# Patient Record
Sex: Female | Born: 1995 | Race: White | Hispanic: No | Smoking: Never smoker
Health system: Southern US, Community
[De-identification: ages and names within clinical notes are randomized; demographics above are authoritative.]

---
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
## 2017-02-27 IMAGING — US US PELVIS COMPLETE
1 series · 15 of 25 positions shown · non-contrast
Comparison: None.

CLINICAL DATA: Cramping and pelvic/vaginal pain. LMP 10/05/2016.
White count 14.8. Urinalysis is positive for white cells, red cells,
bacteria, leukocytes, SE.

EXAM:
TRANSABDOMINAL ULTRASOUND OF PELVIS
TECHNIQUE: Transabdominal ultrasound examination of the pelvis was performed
including evaluation of the uterus, ovaries, adnexal regions, and
pelvic cul-de-sac.

[Series 1: us pelvis complete · 15 of 41 slices shown]
[im 1/41]
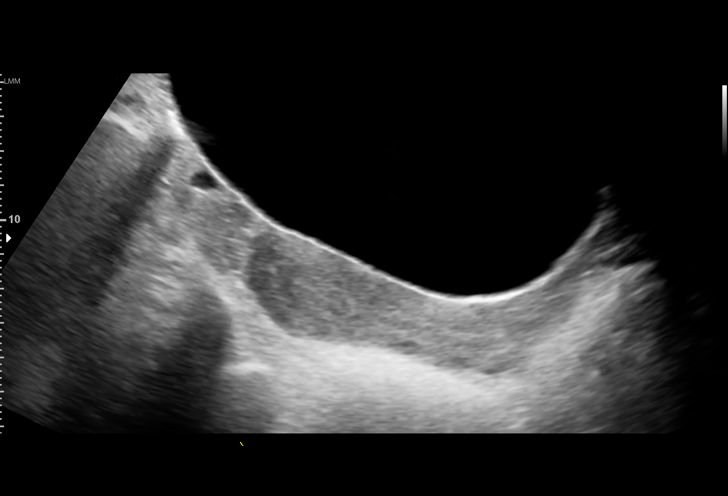
[im 4/41]
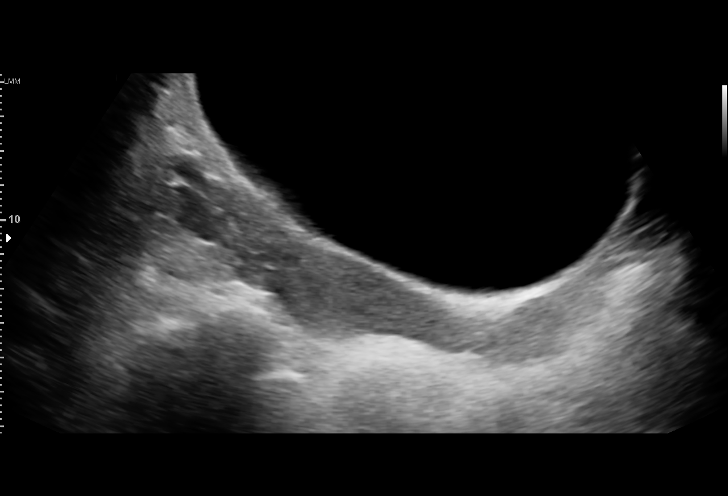
[im 7/41]
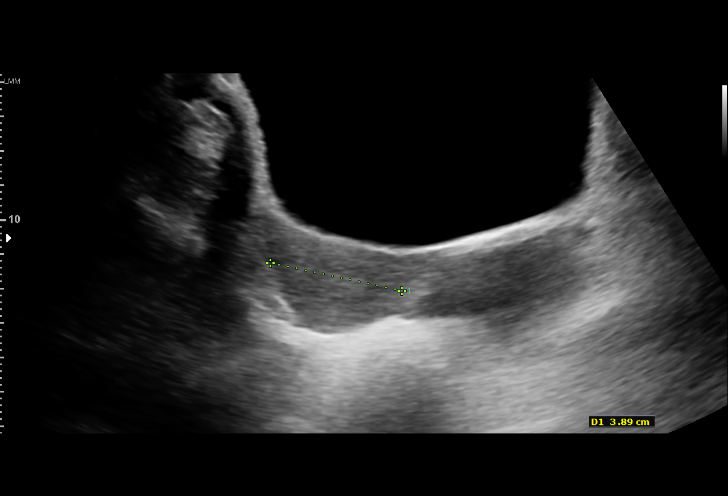
[im 9/41]
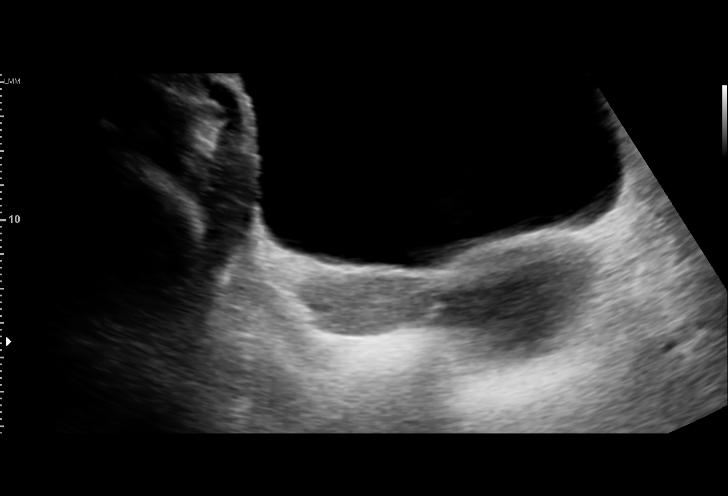
[im 12/41]
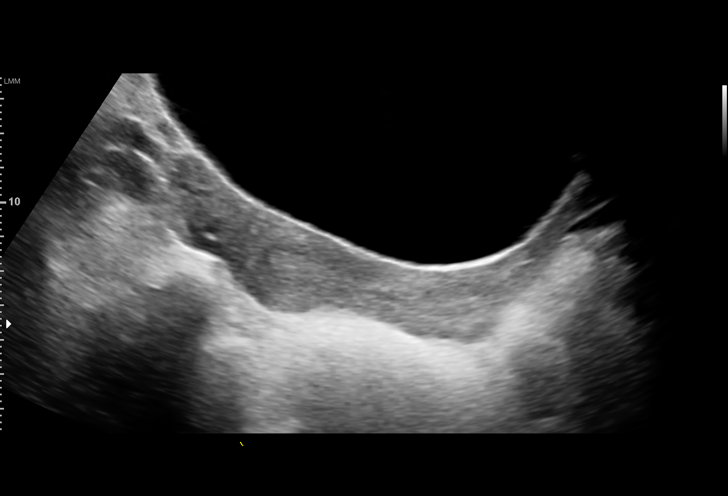
[im 16/41]
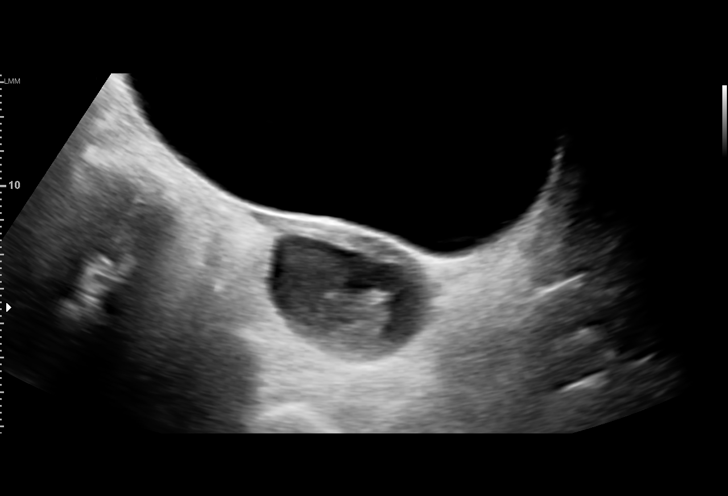
[im 17/41]
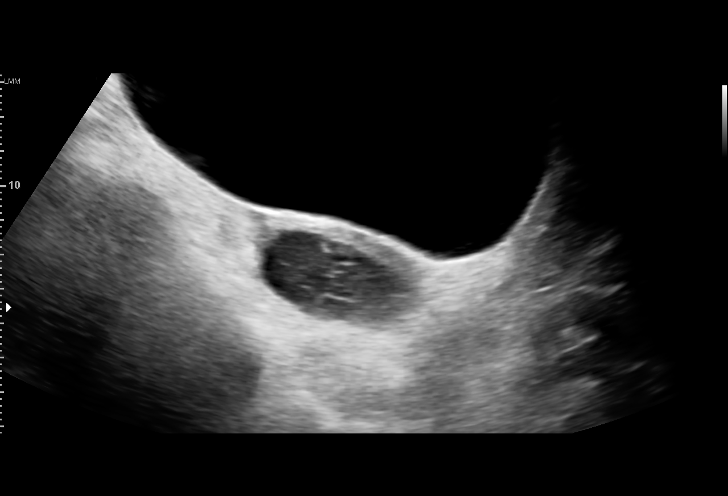
[im 21/41]
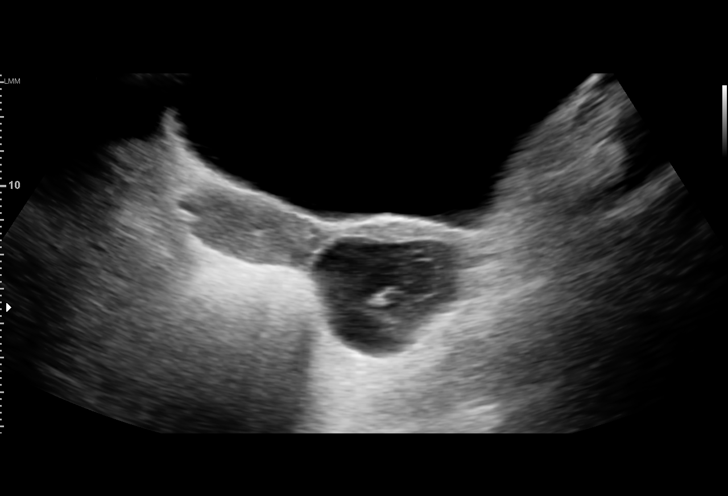
[im 24/41]
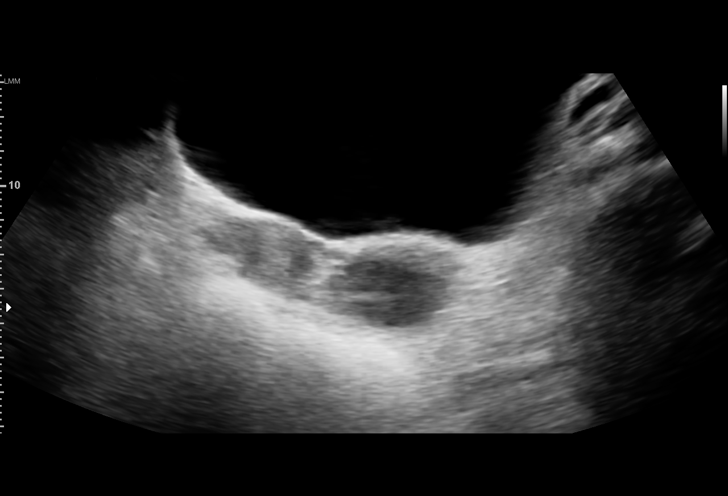
[im 26/41]
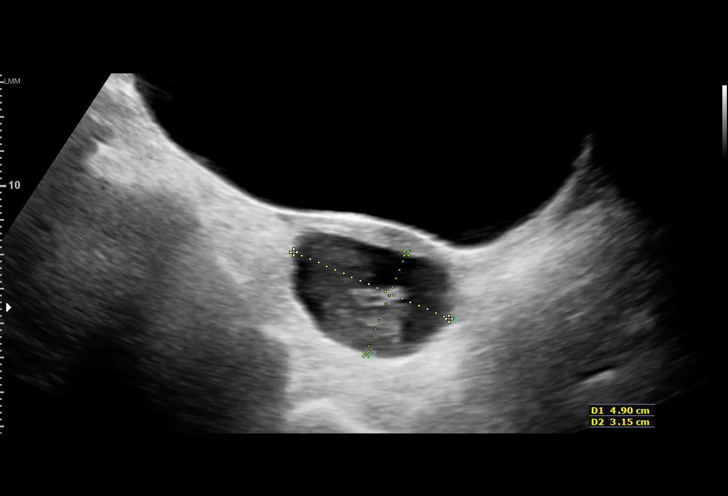
[im 29/41]
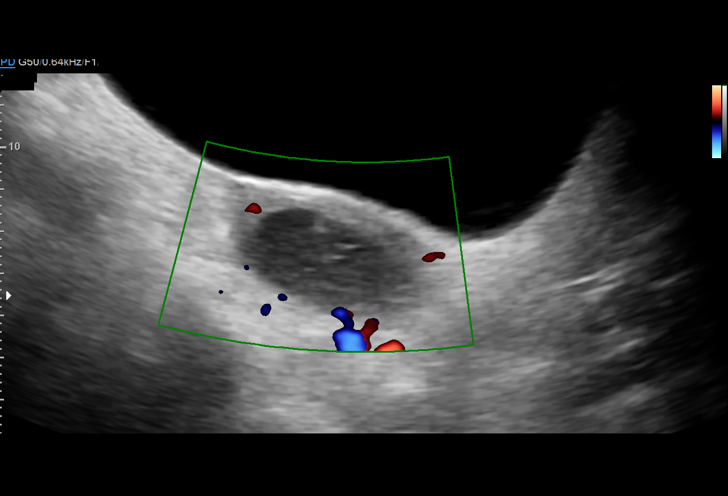
[im 32/41]
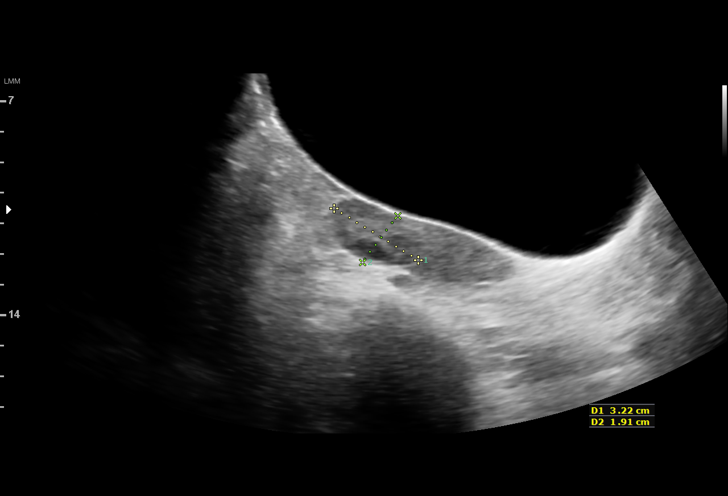
[im 34/41]
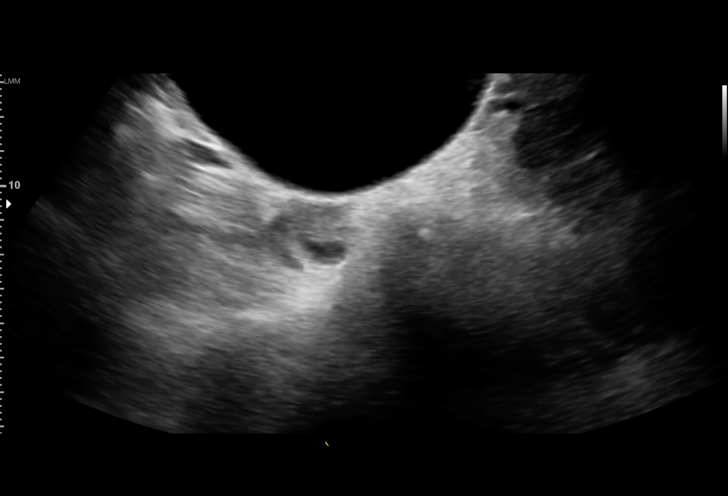
[im 37/41]
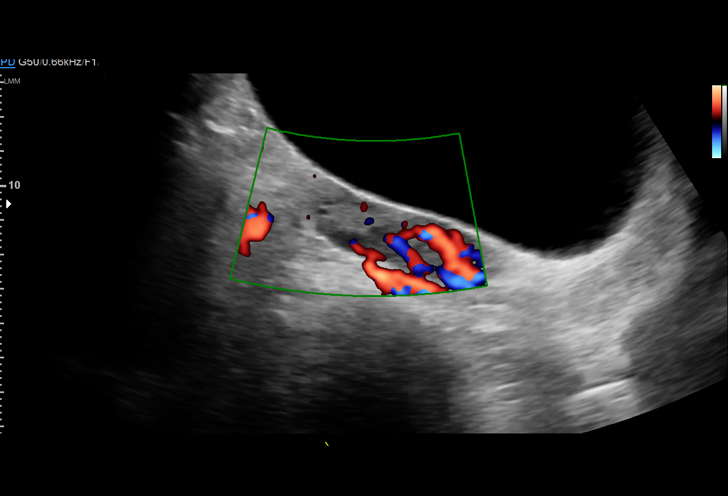
[im 41/41]
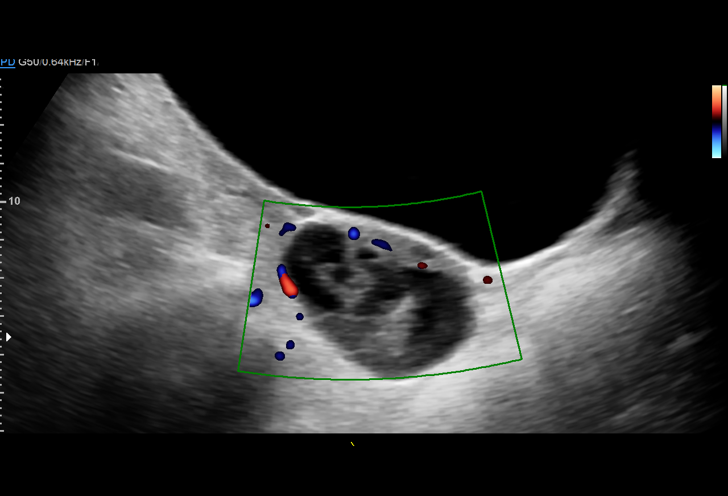

[15 of 25 positions shown; findings below may reference images not displayed]

FINDINGS: Uterus

Measurements: 8.5 x 2.7 x 3.9 cm. No fibroids or other mass
visualized.

Endometrium

Thickness: 7.5 mm.  No focal mass identified.

Right ovary

Measurements: 3.2 x 1.9 x 1.6 cm. Normal appearance/no adnexal mass.

Left ovary

Measurements: 5.3 x 4.1 x 4.9 cm. Within the left ovary there is a
multiloculated cystic mass measuring 4.9 x 3.2 x 4.3 cm. Multiple
internal septations are identified. No suspicious solid components
are identified. Flow Doppler evaluation demonstrates flow in the
periphery of the lesion in its within the ovary.

Other findings:  No abnormal free fluid.
IMPRESSION: 1. Normal appearance of the uterus and right ovary.
2. Septated cystic mass within the left ovary is favored to be
physiologic.
3. Less likely left ovarian mass could be related to tubo-ovarian
abscess, or benign or malignant ovarian neoplasm.
4. Follow-up pelvic ultrasound is recommended in 8-12 weeks to
assess for resolution.

## 2017-07-17 ENCOUNTER — Other Ambulatory Visit: Payer: Self-pay | Admitting: Certified Nurse Midwife

## 2017-08-30 ENCOUNTER — Inpatient Hospital Stay (HOSPITAL_COMMUNITY)
Admission: AD | Admit: 2017-08-30 | Discharge: 2017-08-30 | Disposition: A | Discharge status: Home / Self Care | Payer: Self-pay | Source: Ambulatory Visit | Attending: Obstetrics and Gynecology | Admitting: Obstetrics and Gynecology

## 2017-08-30 ENCOUNTER — Encounter (HOSPITAL_COMMUNITY): Payer: Self-pay | Admitting: Student

## 2017-08-30 DIAGNOSIS — N946 Dysmenorrhea, unspecified: Secondary | ICD-10-CM | POA: Insufficient documentation

## 2017-08-30 DIAGNOSIS — Z3202 Encounter for pregnancy test, result negative: Secondary | ICD-10-CM | POA: Insufficient documentation

## 2017-08-30 LAB — CBC WITH DIFFERENTIAL/PLATELET
BASOS PCT: 0 %
Basophils Absolute: 0 10*3/uL (ref 0.0–0.1)
EOS ABS: 0.1 10*3/uL (ref 0.0–0.7)
EOS PCT: 1 %
HEMATOCRIT: 39.3 % (ref 36.0–46.0)
Hemoglobin: 13.1 g/dL (ref 12.0–15.0)
Lymphocytes Relative: 17 %
Lymphs Abs: 1.7 10*3/uL (ref 0.7–4.0)
MCH: 29.8 pg (ref 26.0–34.0)
MCHC: 33.3 g/dL (ref 30.0–36.0)
MCV: 89.3 fL (ref 78.0–100.0)
MONO ABS: 0.4 10*3/uL (ref 0.1–1.0)
MONOS PCT: 4 %
NEUTROS ABS: 7.9 10*3/uL — AB (ref 1.7–7.7)
Neutrophils Relative %: 78 %
PLATELETS: 268 10*3/uL (ref 150–400)
RBC: 4.4 MIL/uL (ref 3.87–5.11)
RDW: 13.5 % (ref 11.5–15.5)
WBC: 10.1 10*3/uL (ref 4.0–10.5)

## 2017-08-30 LAB — URINALYSIS, ROUTINE W REFLEX MICROSCOPIC
BILIRUBIN URINE: NEGATIVE
GLUCOSE, UA: NEGATIVE mg/dL
KETONES UR: NEGATIVE mg/dL
NITRITE: NEGATIVE
PROTEIN: 100 mg/dL — AB
Specific Gravity, Urine: 1.025 (ref 1.005–1.030)
pH: 5 (ref 5.0–8.0)

## 2017-08-30 LAB — WET PREP, GENITAL
Clue Cells Wet Prep HPF POC: NONE SEEN
Sperm: NONE SEEN
TRICH WET PREP: NONE SEEN
Yeast Wet Prep HPF POC: NONE SEEN

## 2017-08-30 LAB — POCT PREGNANCY, URINE: Preg Test, Ur: NEGATIVE

## 2017-08-30 MED ORDER — IBUPROFEN 600 MG PO TABS: 600.0000 mg | ORAL_TABLET | Freq: Four times a day (QID) | ORAL | 0 refills | Status: DC | PRN

## 2017-08-30 MED ORDER — IBUPROFEN 600 MG PO TABS
600.0000 mg | ORAL_TABLET | Freq: Four times a day (QID) | ORAL | Status: DC | PRN
  Administered 2017-08-30: 600 mg via ORAL
  Filled 2017-08-30: qty 1

## 2017-08-30 NOTE — MAU Note (Signed)
Pt C/o Abdominal pain from the hips down more on the left side.  Pt. Says she had a cyst on her left ovary last time she was here.

## 2017-08-30 NOTE — MAU Provider Note (Signed)
History     CSN: [id number]  Arrival date and time: 08/30/17 4098  First Provider Initiated Contact with Patient 08/30/17 0815     Chief Complaint  Patient presents with  . Abdominal Pain   21 y.o. non-pregnant female here with episode of LAP and near syncope this am. She reports feeling uterine cramping that radiates into vaginal, thighs, and low back. She did not take anything for the pain. She also felt faint, cold, and clammy this am. She started her menses yesterday and had similar sx about 10 mos ago associated with menses. She is not sexually active. Denies vaginal discharge prior to menses. No urinary sx. No fevers. She was seen at [facility] last year for dysmenorrhea and prescribed monthly Progesterone but ran of refills during the summer.    No past medical history on file.  No past surgical history on file.  No family history on file.  Social History  Substance Use Topics  . Smoking status: Never Smoker  . Smokeless tobacco: Never Used  . Alcohol use Not on file    Allergies:  Allergies  Allergen Reactions  . Benzoyl Peroxide Swelling and Other (See Comments)    Reaction:  Facial swelling    Prescriptions Prior to Admission  Medication Sig Dispense Refill Last Dose  . ibuprofen (ADVIL,MOTRIN) 800 MG tablet Take 1 tablet (800 mg total) by mouth every 8 (eight) hours as needed. 30 tablet 0     Review of Systems  Constitutional: Negative for chills and fever.  Gastrointestinal: Positive for abdominal pain.  Genitourinary: Positive for vaginal bleeding and vaginal pain. Negative for dysuria.  Musculoskeletal: Positive for back pain.   Physical Exam   Blood pressure 111/64, pulse 71, temperature 97.8 F (36.6 C), temperature source Oral, resp. rate 16, height 5\' 4"  (1.626 m), weight 144 lb (65.3 kg).  Physical Exam  Nursing note and vitals reviewed. Constitutional: She is oriented to person, place, and time. She appears well-developed and well-nourished.  No distress.  HENT:  Head: Normocephalic and atraumatic.  Neck: Normal range of motion.  Cardiovascular: Normal rate.   Respiratory: No respiratory distress.  GI: Soft. She exhibits no distension and no mass. There is tenderness (mild) in the suprapubic area. There is no rebound and no guarding.  Genitourinary:  Genitourinary Comments: Declined   Musculoskeletal: Normal range of motion.  Neurological: She is alert and oriented to person, place, and time.  Skin: Skin is warm and dry.  Psychiatric: She has a normal mood and affect.   Results for orders placed or performed during the hospital encounter of 08/30/17 (from the past 24 hour(s))  Urinalysis, Routine w reflex microscopic     Status: Abnormal   Collection Time: 08/30/17  6:56 AM  Result Value Ref Range   Color, Urine YELLOW YELLOW   APPearance HAZY (A) CLEAR   Specific Gravity, Urine 1.025 1.005 - 1.030   pH 5.0 5.0 - 8.0   Glucose, UA NEGATIVE NEGATIVE mg/dL   Hgb urine dipstick LARGE (A) NEGATIVE   Bilirubin Urine NEGATIVE NEGATIVE   Ketones, ur NEGATIVE NEGATIVE mg/dL   Protein, ur 119 (A) NEGATIVE mg/dL   Nitrite NEGATIVE NEGATIVE   Leukocytes, UA TRACE (A) NEGATIVE   RBC / HPF TOO NUMEROUS TO COUNT 0 - 5 RBC/hpf   WBC, UA 6-30 0 - 5 WBC/hpf   Bacteria, UA RARE (A) NONE SEEN   Squamous Epithelial / LPF 0-5 (A) NONE SEEN   Mucus PRESENT   Pregnancy, urine  POC     Status: None   Collection Time: 08/30/17  7:14 AM  Result Value Ref Range   Preg Test, Ur NEGATIVE NEGATIVE  Wet prep, genital     Status: Abnormal   Collection Time: 08/30/17  8:36 AM  Result Value Ref Range   Yeast Wet Prep HPF POC NONE SEEN NONE SEEN   Trich, Wet Prep NONE SEEN NONE SEEN   Clue Cells Wet Prep HPF POC NONE SEEN NONE SEEN   WBC, Wet Prep HPF POC FEW (A) NONE SEEN   Sperm NONE SEEN   CBC with Differential/Platelet     Status: Abnormal   Collection Time: 08/30/17  8:45 AM  Result Value Ref Range   WBC 10.1 4.0 - 10.5 K/uL   RBC  4.40 3.87 - 5.11 MIL/uL   Hemoglobin 13.1 12.0 - 15.0 g/dL   HCT 29.539.3 62.136.0 - 30.846.0 %   MCV 89.3 78.0 - 100.0 fL   MCH 29.8 26.0 - 34.0 pg   MCHC 33.3 30.0 - 36.0 g/dL   RDW 65.713.5 84.611.5 - 96.215.5 %   Platelets 268 150 - 400 K/uL   Neutrophils Relative % 78 %   Neutro Abs 7.9 (H) 1.7 - 7.7 K/uL   Lymphocytes Relative 17 %   Lymphs Abs 1.7 0.7 - 4.0 K/uL   Monocytes Relative 4 %   Monocytes Absolute 0.4 0.1 - 1.0 K/uL   Eosinophils Relative 1 %   Eosinophils Absolute 0.1 0.0 - 0.7 K/uL   Basophils Relative 0 %   Basophils Absolute 0.0 0.0 - 0.1 K/uL   MAU Course  Procedures Ibuprofen  MDM Labs ordered and reviewed. No evidence of acute abdominal or pelvic process. Recommend start Ibuprofen the day before anticiated menses and continue for the first several days. Recommend f/u with primary GYN for other dysmenorrhea treatments. Stable for discharge home.  Assessment and Plan   1. Dysmenorrhea    Discharge home Follow up at [facility] in 1-2 weeks Rx Ibuprofen  Allergies as of 08/30/2017      Reactions   Benzoyl Peroxide Swelling, Other (See Comments)   Reaction:  Facial swelling      Medication List    TAKE these medications   ibuprofen 600 MG tablet Commonly known as:  ADVIL,MOTRIN Take 1 tablet (600 mg total) by mouth every 6 (six) hours as needed for cramping. What changed:  medication strength  how much to take  reasons to take this      Donette LarryMelanie Jacquelina Hewins, CNM 08/30/2017, 8:31 AM

## 2017-08-30 NOTE — Discharge Instructions (Signed)
Dysmenorrhea Dysmenorrhea means painful cramps during your period (menstrual period). You will have pain in your lower belly (abdomen). The pain is caused by the tightening (contracting) of the muscles of the womb (uterus). The pain may be mild or very bad. With this condition, you may:  Have a headache.  Feel sick to your stomach (nauseous).  Throw up (vomit).  Have lower back pain. Follow these instructions at home: Helping pain and cramping   Put heat on your lower back or belly when you have pain or cramps. Use the heat source that your doctor tells you to use.  Place a towel between your skin and the heat.  Leave the heat on for 20-30 minutes.  Remove the heat if your skin turns bright red. This is especially important if you cannot feel pain, heat, or cold.  Do not have a heating pad on during sleep.  Do aerobic exercises. These include walking, swimming, or biking. These may help with cramps.  Massage your lower back or belly. This may help lessen pain. General instructions   Take over-the-counter and prescription medicines only as told by your doctor.  Do not drive or use heavy machinery while taking prescription pain medicine.  Avoid alcohol and caffeine during and right before your period. These can make cramps worse.  Do not use any products that have nicotine or tobacco. These include cigarettes and e-cigarettes. If you need help quitting, ask your doctor.  Keep all follow-up visits as told by your doctor. This is important. Contact a doctor if:  You have pain that gets worse.  You have pain that does not get better with medicine.  You have pain during sex.  You feel sick to your stomach or you throw up during your period, and medicine does not help. Get help right away if:  You pass out (faint). Summary  Dysmenorrhea means painful cramps during your period (menstrual period).  Put heat on your lower back or belly when you have pain or cramps.  Do  exercises like walking, swimming, or biking to help with cramps.  Contact a doctor if you have pain during sex. This information is not intended to replace advice given to you by your health care provider. Make sure you discuss any questions you have with your health care provider. Document Released: 01/26/2009 Document Revised: 11/16/2016 Document Reviewed: 11/16/2016 Elsevier Interactive Patient Education  2017 [employer]  

## 2017-08-31 LAB — GC/CHLAMYDIA PROBE AMP (~~LOC~~) NOT AT ARMC
CHLAMYDIA, DNA PROBE: NEGATIVE
NEISSERIA GONORRHEA: NEGATIVE

## 2019-06-10 ENCOUNTER — Encounter (HOSPITAL_COMMUNITY): Payer: Self-pay | Admitting: Emergency Medicine

## 2019-06-10 ENCOUNTER — Observation Stay (HOSPITAL_COMMUNITY)
Admission: EM | Admit: 2019-06-10 | Discharge: 2019-06-11 | Disposition: A | Discharge status: Home / Self Care | Payer: Medicaid Other | Attending: General Surgery | Admitting: General Surgery

## 2019-06-10 ENCOUNTER — Emergency Department (HOSPITAL_COMMUNITY): Payer: Medicaid Other

## 2019-06-10 ENCOUNTER — Other Ambulatory Visit: Payer: Self-pay

## 2019-06-10 DIAGNOSIS — Z20828 Contact with and (suspected) exposure to other viral communicable diseases: Secondary | ICD-10-CM | POA: Diagnosis not present

## 2019-06-10 DIAGNOSIS — Z9049 Acquired absence of other specified parts of digestive tract: Secondary | ICD-10-CM

## 2019-06-10 DIAGNOSIS — K3580 Unspecified acute appendicitis: Principal | ICD-10-CM | POA: Diagnosis present

## 2019-06-10 DIAGNOSIS — R1031 Right lower quadrant pain: Secondary | ICD-10-CM

## 2019-06-10 LAB — COMPREHENSIVE METABOLIC PANEL
ALT: 12 U/L (ref 0–44)
AST: 16 U/L (ref 15–41)
Albumin: 4.4 g/dL (ref 3.5–5.0)
Alkaline Phosphatase: 51 U/L (ref 38–126)
Anion gap: 11 (ref 5–15)
BUN: 9 mg/dL (ref 6–20)
CO2: 24 mmol/L (ref 22–32)
Calcium: 9.6 mg/dL (ref 8.9–10.3)
Chloride: 105 mmol/L (ref 98–111)
Creatinine, Ser: 0.83 mg/dL (ref 0.44–1.00)
GFR calc Af Amer: >60 mL/min (ref >60)
GFR calc non Af Amer: >60 mL/min (ref >60)
Glucose, Bld: 89 mg/dL (ref 70–99)
Potassium: 3.9 mmol/L (ref 3.5–5.1)
Sodium: 140 mmol/L (ref 135–145)
Total Bilirubin: 0.7 mg/dL (ref 0.3–1.2)
Total Protein: 7.7 g/dL (ref 6.5–8.1)

## 2019-06-10 LAB — URINALYSIS, ROUTINE W REFLEX MICROSCOPIC
Bilirubin Urine: NEGATIVE
Glucose, UA: NEGATIVE mg/dL
Hgb urine dipstick: NEGATIVE
Ketones, ur: 5 mg/dL — AB
Leukocytes,Ua: NEGATIVE
Nitrite: NEGATIVE
Protein, ur: NEGATIVE mg/dL
Specific Gravity, Urine: 1.03 (ref 1.005–1.030)
pH: 5 (ref 5.0–8.0)

## 2019-06-10 LAB — CBC
HCT: 42.8 % (ref 36.0–46.0)
Hemoglobin: 13.6 g/dL (ref 12.0–15.0)
MCH: 29.7 pg (ref 26.0–34.0)
MCHC: 31.8 g/dL (ref 30.0–36.0)
MCV: 93.4 fL (ref 80.0–100.0)
Platelets: 311 10*3/uL (ref 150–400)
RBC: 4.58 MIL/uL (ref 3.87–5.11)
RDW: 13 % (ref 11.5–15.5)
WBC: 9.5 10*3/uL (ref 4.0–10.5)
nRBC: 0 % (ref 0.0–0.2)

## 2019-06-10 LAB — SARS CORONAVIRUS 2 BY RT PCR (HOSPITAL ORDER, PERFORMED IN ~~LOC~~ HOSPITAL LAB): SARS Coronavirus 2: NEGATIVE

## 2019-06-10 LAB — I-STAT BETA HCG BLOOD, ED (MC, WL, AP ONLY): I-stat hCG, quantitative: <5 m[IU]/mL (ref <5)

## 2019-06-10 LAB — LIPASE, BLOOD: Lipase: 22 U/L (ref 11–51)

## 2019-06-10 MED ORDER — METRONIDAZOLE IN NACL 5-0.79 MG/ML-% IV SOLN
500.0000 mg | Freq: Three times a day (TID) | INTRAVENOUS | Status: DC
  Administered 2019-06-11: 500 mg via INTRAVENOUS
  Filled 2019-06-10: qty 100

## 2019-06-10 MED ORDER — HYDROMORPHONE HCL 1 MG/ML IJ SOLN: 0.5000 mg | INTRAMUSCULAR | Status: DC | PRN

## 2019-06-10 MED ORDER — METRONIDAZOLE IN NACL 5-0.79 MG/ML-% IV SOLN
500.0000 mg | Freq: Once | INTRAVENOUS | Status: AC
  Administered 2019-06-10: 500 mg via INTRAVENOUS
  Filled 2019-06-10: qty 100

## 2019-06-10 MED ORDER — KETOROLAC TROMETHAMINE 30 MG/ML IJ SOLN: 30.0000 mg | Freq: Four times a day (QID) | INTRAMUSCULAR | Status: DC | PRN

## 2019-06-10 MED ORDER — DEXTROSE-NACL 5-0.9 % IV SOLN
INTRAVENOUS | Status: DC
  Administered 2019-06-11 (×2): via INTRAVENOUS

## 2019-06-10 MED ORDER — ONDANSETRON 4 MG PO TBDP: 4.0000 mg | ORAL_TABLET | Freq: Four times a day (QID) | ORAL | Status: DC | PRN

## 2019-06-10 MED ORDER — IOHEXOL 300 MG/ML  SOLN
100.0000 mL | Freq: Once | INTRAMUSCULAR | Status: AC | PRN
  Administered 2019-06-10: 100 mL via INTRAVENOUS

## 2019-06-10 MED ORDER — SODIUM CHLORIDE 0.9 % IV SOLN
2.0000 g | INTRAVENOUS | Status: DC
  Filled 2019-06-10: qty 20

## 2019-06-10 MED ORDER — SODIUM CHLORIDE 0.9% FLUSH: 3.0000 mL | Freq: Once | INTRAVENOUS | Status: DC

## 2019-06-10 MED ORDER — SODIUM CHLORIDE 0.9 % IV BOLUS
500.0000 mL | Freq: Once | INTRAVENOUS | Status: AC
  Administered 2019-06-10: 500 mL via INTRAVENOUS

## 2019-06-10 MED ORDER — SODIUM CHLORIDE 0.9 % IV SOLN
2.0000 g | Freq: Once | INTRAVENOUS | Status: AC
  Administered 2019-06-10: 2 g via INTRAVENOUS
  Filled 2019-06-10: qty 20

## 2019-06-10 MED ORDER — ONDANSETRON HCL 4 MG/2ML IJ SOLN: 4.0000 mg | Freq: Four times a day (QID) | INTRAMUSCULAR | Status: DC | PRN

## 2019-06-10 NOTE — Discharge Instructions (Addendum)
The ED would like you to follow-up with the women's clinic.  They have a walk-in gynecology clinic Monday-  Wednesday from 4 PM to 7:30 PM.  No appointment is necessary.  [facility] LAPAROSCOPIC SURGERY: POST OP INSTRUCTIONS Always review your discharge instruction sheet given to you by the facility where your surgery was performed. IF YOU HAVE DISABILITY OR FAMILY LEAVE FORMS, YOU MUST BRING THEM TO THE OFFICE FOR PROCESSING.   DO NOT GIVE THEM TO YOUR DOCTOR.  PAIN CONTROL  1. First take acetaminophen (Tylenol) AND/or ibuprofen (Advil) to control your pain after surgery.  Follow directions on package.  Taking acetaminophen (Tylenol) and/or ibuprofen (Advil) regularly after surgery will help to control your pain and lower the amount of prescription pain medication you may need.  You should not take more than 3,000 mg (3 grams) of acetaminophen (Tylenol) in 24 hours.  You should not take ibuprofen (Advil), aleve, motrin, naprosyn or other NSAIDS if you have a history of stomach ulcers or chronic kidney disease.  2. A prescription for pain medication may be given to you upon discharge.  Take your pain medication as prescribed, if you still have uncontrolled pain after taking acetaminophen (Tylenol) or ibuprofen (Advil). 3. Use ice packs to help control pain. 4. If you need a refill on your pain medication, please contact your pharmacy.  They will contact our office to request authorization. Prescriptions will not be filled after 5pm or on week-ends.  HOME MEDICATIONS 5. Take your usually prescribed medications unless otherwise directed.  DIET 6. You should follow a light diet the first few days after arrival home.  Be sure to include lots of fluids daily. Avoid fatty, fried foods.   CONSTIPATION 7. It is common to experience some constipation after surgery and if you are taking pain medication.  Increasing fluid intake and taking a stool softener (such as Colace) will  usually help or prevent this problem from occurring.  A mild laxative (Milk of Magnesia or Miralax) should be taken according to package instructions if there are no bowel movements after 48 hours.  WOUND/INCISION CARE 8. Most patients will experience some swelling and bruising in the area of the incisions.  Ice packs will help.  Swelling and bruising can take several days to resolve.  9. Unless discharge instructions indicate otherwise, follow guidelines below  a. STERI-STRIPS - you may remove your outer bandages 48 hours after surgery, and you may shower at that time.  You have steri-strips (small skin tapes) in place directly over the incision.  These strips should be left on the skin for 7-10 days.   b. DERMABOND/SKIN GLUE - you may shower in 24 hours.  The glue will flake off over the next 2-3 weeks. 10. Any sutures or staples will be removed at the office during your follow-up visit.  ACTIVITIES 11. You may resume regular (light) daily activities beginning the next day--such as daily self-care, walking, climbing stairs--gradually increasing activities as tolerated.  You may have sexual intercourse when it is comfortable.  Refrain from any heavy lifting or straining until approved by your doctor. a. You may drive when you are no longer taking prescription pain medication, you can comfortably wear a seatbelt, and you can safely maneuver your car and apply brakes.  FOLLOW-UP 12. You should see your doctor in the office for a follow-up appointment approximately 2-3 weeks after your surgery.  You should have been given your post-op/follow-up appointment when your surgery was scheduled.  If you did not receive a post-op/follow-up appointment, make sure that you call for this appointment within a day or two after you arrive home to insure a convenient appointment time.  OTHER INSTRUCTIONS 13.   WHEN TO CALL YOUR DOCTOR: 1. Fever over 101.0 2. Inability to urinate 3. Continued bleeding from  incision. 4. Increased pain, redness, or drainage from the incision. 5. Increasing abdominal pain  The clinic staff is available to answer your questions during regular business hours.  Please dont hesitate to call and ask to speak to one of the nurses for clinical concerns.  If you have a medical emergency, go to the nearest emergency room or call 911.  [facility] is always on call at the hospital. [address], [address], [place], [place]  [id number] ? [address] [address], [place], [place]   [id number] [phone] ? [phone] ? FAX [phone] Web site: [email or web address]     Managing Your Pain After Surgery Without Opioids    Thank you for participating in our program to help patients manage their pain after surgery without opioids. This is part of our effort to provide you with the best care possible, without exposing you or your family to the risk that opioids pose.  What pain can I expect after surgery? You can expect to have some pain after surgery. This is normal. The pain is typically worse the day after surgery, and quickly begins to get better. Many studies have found that many patients are able to manage their pain after surgery with Over-the-Counter (OTC) medications such as Tylenol and Motrin. If you have a condition that does not allow you to take Tylenol or Motrin, notify your surgical team.  How will I manage my pain? The best strategy for controlling your pain after surgery is around the clock pain control with Tylenol (acetaminophen) and Motrin (ibuprofen or Advil). Alternating these medications with each other allows you to maximize your pain control. In addition to Tylenol and Motrin, you can use heating pads or ice packs on your incisions to help reduce your pain.  How will I alternate your regular strength over-the-counter pain medication? You will take a dose of pain medication every three hours. ; Start by  taking 650 mg of Tylenol (2 pills of 325 mg) ; 3 hours later take 600 mg of Motrin (3 pills of 200 mg) ; 3 hours after taking the Motrin take 650 mg of Tylenol ; 3 hours after that take 600 mg of Motrin.   - 1 -  See example - if your first dose of Tylenol is at 12:00 PM   12:00 PM Tylenol 650 mg (2 pills of 325 mg)  3:00 PM Motrin 600 mg (3 pills of 200 mg)  6:00 PM Tylenol 650 mg (2 pills of 325 mg)  9:00 PM Motrin 600 mg (3 pills of 200 mg)  Continue alternating every 3 hours   We recommend that you follow this schedule around-the-clock for at least 3 days after surgery, or until you feel that it is no longer needed. Use the table on the last page of this handout to keep track of the medications you are taking. Important: Do not take more than 3000mg  of Tylenol or 2300mg  of Motrin in a 24-hour period. Do not take ibuprofen/Motrin if you have a history of bleeding stomach ulcers, severe kidney disease, &/or actively taking a blood thinner  What if I still have pain? If you have pain that  is not controlled with the over-the-counter pain medications (Tylenol and Motrin or Advil) you might have what we call breakthrough pain. You will receive a prescription for a small amount of an opioid pain medication such as Oxycodone, Tramadol, or Tylenol with Codeine. Use these opioid pills in the first 24 hours after surgery if you have breakthrough pain. Do not take more than 1 pill every 4-6 hours.  If you still have uncontrolled pain after using all opioid pills, don't hesitate to call our staff using the number provided. We will help make sure you are managing your pain in the best way possible, and if necessary, we can provide a prescription for additional pain medication.   Day 1    Time  Name of Medication Number of pills taken  Amount of Acetaminophen  Pain Level   Comments  AM PM       AM PM       AM PM       AM PM       AM PM       AM PM       AM PM       AM PM        Total Daily amount of Acetaminophen Do not take more than  3,000 mg per day      Day 2    Time  Name of Medication Number of pills taken  Amount of Acetaminophen  Pain Level   Comments  AM PM       AM PM       AM PM       AM PM       AM PM       AM PM       AM PM       AM PM       Total Daily amount of Acetaminophen Do not take more than  3,000 mg per day      Day 3    Time  Name of Medication Number of pills taken  Amount of Acetaminophen  Pain Level   Comments  AM PM       AM PM       AM PM       AM PM          AM PM       AM PM       AM PM       AM PM       Total Daily amount of Acetaminophen Do not take more than  3,000 mg per day      Day 4    Time  Name of Medication Number of pills taken  Amount of Acetaminophen  Pain Level   Comments  AM PM       AM PM       AM PM       AM PM       AM PM       AM PM       AM PM       AM PM       Total Daily amount of Acetaminophen Do not take more than  3,000 mg per day      Day 5    Time  Name of Medication Number of pills taken  Amount of Acetaminophen  Pain Level   Comments  AM PM       AM PM  AM PM       AM PM       AM PM       AM PM       AM PM       AM PM       Total Daily amount of Acetaminophen Do not take more than  3,000 mg per day       Day 6    Time  Name of Medication Number of pills taken  Amount of Acetaminophen  Pain Level  Comments  AM PM       AM PM       AM PM       AM PM       AM PM       AM PM       AM PM       AM PM       Total Daily amount of Acetaminophen Do not take more than  3,000 mg per day      Day 7    Time  Name of Medication Number of pills taken  Amount of Acetaminophen  Pain Level   Comments  AM PM       AM PM       AM PM       AM PM       AM PM       AM PM       AM PM       AM PM       Total Daily amount of Acetaminophen Do not take more than  3,000 mg per day        For additional information about how  and where to safely dispose of unused opioid medications - [employer]https://www.[employer].org  Disclaimer: This document contains information and/or instructional materials adapted from [facility] for the typical patient with your condition. It does not replace medical advice from your health care provider because your experience may differ from that of the typical patient. Talk to your health care provider if you have any questions about this document, your condition or your treatment plan. Adapted from [facility]

## 2019-06-10 NOTE — ED Triage Notes (Signed)
TC to [place] to report Pt drank a pitcher of water and now feels full.

## 2019-06-10 NOTE — ED Notes (Signed)
Patient transported to Ultrasound 

## 2019-06-10 NOTE — ED Provider Notes (Signed)
I assumed care of patient from previous team, please see their note for full H&P.  Briefly patient is here for evaluation of pelvic pain.  It initially started around her umbilicus and is worse in the right lower quadrant.   Physical Exam  BP 108/76   Pulse 74   Temp 98.4 F (36.9 C) (Oral)   Resp 16   Ht 5\' 5"  (1.651 m)   Wt 64 kg   LMP 05/29/2019   SpO2 100%   BMI 23.48 kg/m   Physical Exam Vitals signs and nursing note reviewed.  Constitutional:      General: She is not in acute distress.    Appearance: She is well-developed. She is not diaphoretic.  HENT:     Head: Normocephalic and atraumatic.  Eyes:     General: No scleral icterus.       Right eye: No discharge.        Left eye: No discharge.     Conjunctiva/sclera: Conjunctivae normal.  Neck:     Musculoskeletal: Normal range of motion.  Cardiovascular:     Rate and Rhythm: Normal rate and regular rhythm.  Pulmonary:     Effort: Pulmonary effort is normal. No respiratory distress.     Breath sounds: No stridor.  Abdominal:     General: There is no distension.     Tenderness: There is abdominal tenderness (Worse in RLQ) in the right lower quadrant, periumbilical area and suprapubic area. Positive signs include Murphy's sign.  Musculoskeletal:        General: No deformity.  Skin:    General: Skin is warm and dry.  Neurological:     Mental Status: She is alert.     Motor: No abnormal muscle tone.  Psychiatric:        Behavior: Behavior normal.     ED Course/Procedures   Clinical Course as of Jun 09 2320  Tue Jun 10, 2019  2040 Appendix is 44mm, wall enhancement, no stranding Spoke with radiologist who reports that CT imaging is equivocal, recommend surgical consult.   [EH]  2053 Spoke with Dr. Rosendo Gros from general surgery who will come see patient for admission.   [EH]    Clinical Course User Index [EH] Lorin Glass, PA-C    Procedures   Medications  sodium chloride 0.9 % bolus 500 mL (0  mLs Intravenous Stopped 06/10/19 2144)  iohexol (OMNIPAQUE) 300 MG/ML solution 100 mL (100 mLs Intravenous Contrast Given 06/10/19 2015)  cefTRIAXone (ROCEPHIN) 2 g in sodium chloride 0.9 % 100 mL IVPB (2 g Intravenous New Bag/Given 06/10/19 2124)    And  metroNIDAZOLE (FLAGYL) IVPB 500 mg (500 mg Intravenous New Bag/Given 06/10/19 2123)    US Pelvis (transabdominal Only)  Result Date: 06/10/2019 CLINICAL DATA:  Right lower quadrant abdomen pain for 2 days EXAM: TRANSABDOMINAL ULTRASOUND OF PELVIS TECHNIQUE: Transabdominal ultrasound examination of the pelvis was performed including evaluation of the uterus, ovaries, adnexal regions, and pelvic cul-de-sac. COMPARISON:  None. FINDINGS: Uterus Measurements: 7.8 x 2.8 x 4.1 cm = volume: 47.3 mL. No fibroids or other mass visualized. Endometrium Thickness: 4.1.  No focal abnormality visualized. Right ovary Measurements: 5 x 2.4 x 2 cm = volume: 12.66 mL. Normal appearance/no adnexal mass. Left ovary Measurements: 2.6 x 1.6 x 2.6 cm = volume: 5.4 mL. Normal appearance/no adnexal mass. Other findings:  No abnormal free fluid. IMPRESSION: Normal pelvic ultrasound. Electronically Signed   By: Abelardo Diesel M.D.   On: 06/10/2019 18:12   Ct  Abdomen Pelvis W Contrast  Result Date: 06/10/2019 CLINICAL DATA:  Abdominal pain and vaginal discharge EXAM: CT ABDOMEN AND PELVIS WITH CONTRAST TECHNIQUE: Multidetector CT imaging of the abdomen and pelvis was performed using the standard protocol following bolus administration of intravenous contrast. CONTRAST:  100mL OMNIPAQUE IOHEXOL 300 MG/ML  SOLN COMPARISON:  None. FINDINGS: Lower chest: There is slight atelectatic change in the left base. Lung bases otherwise are clear. Hepatobiliary: No focal liver lesions are evident. The gallbladder wall is not appreciably thickened. There is no biliary duct dilatation. Pancreas: There is no pancreatic mass or inflammatory focus. Spleen: No splenic lesions are evident. Adrenals/Urinary  Tract: Adrenals bilaterally appear unremarkable. Kidneys bilaterally show no evident mass or hydronephrosis on either side. There is no appreciable renal or ureteral calculus on either side. Urinary bladder is midline with wall thickness within normal limits. Stomach/Bowel: Rectum is mildly distended with stool and air. There is no appreciable bowel wall or mesenteric thickening. There is no evident bowel obstruction. The terminal ileum appears unremarkable. There is no evident free air or portal venous air. Vascular/Lymphatic: There is no abdominal aortic aneurysm. No vascular lesions are evident. No adenopathy evident in the abdomen or pelvis. Reproductive: Uterus is anteverted.  No pelvic mass evident. Other: The appendix is prominent, measuring up to 9 mm in diameter. There is subtle enhancement of the appendiceal wall. There is no appendicolith. There is no appreciable soft tissue stranding. No fluid or abscess in this area. No evident perforation. No abscess or ascites evident in the abdomen or pelvis. Musculoskeletal: No blastic or lytic bone lesions. No intramuscular or abdominal wall lesions. IMPRESSION: 1. Appendix is mildly distended to 9 mm with mild enhancement of the appendiceal wall. These are findings which are concerning for early changes of appendicitis. There is no soft tissue stranding or fluid in the appendiceal region. No appendicoliths. These findings must be viewed as somewhat equivocal but do warrant close clinical and laboratory surveillance. If clinical symptoms so warrant, surgical consultation may be advised given these findings. Note that the appendix arises inferiorly and medially from the cecum, located fairly anterior in the upper to mid pelvis. 2. No bowel obstruction. No abscess in the abdomen or pelvis. Rectum mildly distended with stool and air. 3. No evident renal or ureteral calculus. No hydronephrosis. Urinary bladder midline with wall thickness within normal limits. Critical  Value/emergent results were called by telephone at the time of interpretation on 06/10/2019 at 8:39 pm to [facility]ale Schlack, PA , who verbally acknowledged these results. Electronically Signed   By: Bretta BangWilliam  Woodruff III M.D.   On: 06/10/2019 20:41     MDM  Plan is to follow-up on pelvic ultrasound, if normal anticipate discharge home.  On my exam/repeat abdominal exam patient has pain in the right lower quadrant primarily.  On review of labs she also has a slight leukocytosis.  While she is having spotting and bleeding, her pelvic ultrasound was normal.  I had discussion with the patient and her mother about need to evaluate for appendicitis giving that her pain is in the periumbilical region settled into the right lower quadrant with mild leukocytosis and a normal pelvic ultrasound.  CT scan ordered.  CT scan shows mild distention of the appendix to 9 mm with mild enhancement of the wall concerning for the early changes of appendicitis.  Given her clinical picture including periumbilical pain into the right lower quadrant general surgery was consulted.  I spoke with Dr. Derrell Lollingamirez who will see the  patient for admission.  Orders placed for Flagyl and Rocephin.  She was given saline while in the department.  She was offered pain and nausea medicine which she refused.     Norman ClayHammond, Jaquay Morneault W, PA-C 06/10/19 Janetta [id number]    Benjiman CorePickering, Nathan, MD 06/11/19 [phone]

## 2019-06-10 NOTE — ED Triage Notes (Addendum)
Pt provided with a pitcure of water and instructed to call staff when she feels her bladder is full. Pt instructed not void because  [place] needs Pt to have full bladder for scan.

## 2019-06-10 NOTE — H&P (Signed)
Heidi Benjamin is an 23 y.o. female.   Chief Complaint: Abdominal pain   HPI: Patient is a 23 year old female who comes in today secondary to 2 days of abdominal pain.  Patient states that pain began periumbilically and then slowly moved to the pelvic region.  She states that this eventually migrated to the right lower quadrant area.  Patient states that she had no nausea or vomiting.  She denies any diarrhea.  Patient states that secondary to abdominal pain she had minimal energy or movement today.  Secondary to pain she presented to the ER for further evaluation  Upon evaluation the ER she underwent CT scan and laboratory studies.  Laboratory studies revealed normal white count.  CT scan revealed a thickened appendiceal wall, no fat stranding.  I did review these personally.  General surgery was consulted for further evaluation and management.  History reviewed. No pertinent past medical history.  History reviewed. No pertinent surgical history.  No family history on file. Social History:  reports that she has never smoked. She has never used smokeless tobacco. She reports that she does not drink alcohol or use drugs.  Allergies:  Allergies  Allergen Reactions  . Benzoyl Peroxide Swelling and Other (See Comments)    Reaction:  Facial swelling  . Tomato Nausea And Vomiting    (Not in a hospital admission)   Results for orders placed or performed during the hospital encounter of 06/10/19 (from the past 48 hour(s))  Lipase, blood     Status: None   Collection Time: 06/10/19 12:00 PM  Result Value Ref Range   Lipase 22 11 - 51 U/L    Comment: Performed at [facility], [address] [place], [place] [address]  Comprehensive metabolic panel     Status: None   Collection Time: 06/10/19 12:00 PM  Result Value Ref Range   Sodium 140 135 - 145 mmol/L   Potassium 3.9 3.5 - 5.1 mmol/L   Chloride 105 98 - 111 mmol/L   CO2 24 22 - 32 mmol/L   Glucose, Bld 89 70 - 99 mg/dL   BUN 9 6  - 20 mg/dL   Creatinine, Ser 7.820.83 0.44 - 1.00 mg/dL   Calcium 9.6 8.9 - 95.610.3 mg/dL   Total Protein 7.7 6.5 - 8.1 g/dL   Albumin 4.4 3.5 - 5.0 g/dL   AST 16 15 - 41 U/L   ALT 12 0 - 44 U/L   Alkaline Phosphatase 51 38 - 126 U/L   Total Bilirubin 0.7 0.3 - 1.2 mg/dL   GFR calc non Af Amer >60 >60 mL/min   GFR calc Af Amer >60 >60 mL/min   Anion gap 11 5 - 15    Comment: Performed at [facility], [address] [place], [place] [address]  CBC     Status: None   Collection Time: 06/10/19 12:00 PM  Result Value Ref Range   WBC 9.5 4.0 - 10.5 K/uL   RBC 4.58 3.87 - 5.11 MIL/uL   Hemoglobin 13.6 12.0 - 15.0 g/dL   HCT 65.742.8 84.636.0 - 96.246.0 %   MCV 93.4 80.0 - 100.0 fL   MCH 29.7 26.0 - 34.0 pg   MCHC 31.8 30.0 - 36.0 g/dL   RDW 95.213.0 84.111.5 - 32.415.5 %   Platelets 311 150 - 400 K/uL   nRBC 0.0 0.0 - 0.2 %    Comment: Performed at [facility], [address] [place], [place] [address]  I-Stat  beta hCG blood, ED     Status: None   Collection Time: 06/10/19 12:52 PM  Result Value Ref Range   I-stat hCG, quantitative <5.0 <5 mIU/mL   Comment 3            Comment:   GEST. AGE      CONC.  (mIU/mL)   <=1 WEEK        5 - 50     2 WEEKS       50 - 500     3 WEEKS       100 - 10,000     4 WEEKS     1,000 - 30,000        FEMALE AND NON-PREGNANT FEMALE:     LESS THAN 5 mIU/mL   Urinalysis, Routine w reflex microscopic     Status: Abnormal   Collection Time: 06/10/19  5:00 PM  Result Value Ref Range   Color, Urine YELLOW YELLOW   APPearance CLEAR CLEAR   Specific Gravity, Urine 1.030 1.005 - 1.030   pH 5.0 5.0 - 8.0   Glucose, UA NEGATIVE NEGATIVE mg/dL   Hgb urine dipstick NEGATIVE NEGATIVE   Bilirubin Urine NEGATIVE NEGATIVE   Ketones, ur 5 (A) NEGATIVE mg/dL   Protein, ur NEGATIVE NEGATIVE mg/dL   Nitrite NEGATIVE NEGATIVE   Leukocytes,Ua NEGATIVE NEGATIVE    Comment: Performed at [facility], [place] [address] [place], [place] [address]   US Pelvis (transabdominal  Only)  Result Date: 06/10/2019 CLINICAL DATA:  Right lower quadrant abdomen pain for 2 days EXAM: TRANSABDOMINAL ULTRASOUND OF PELVIS TECHNIQUE: Transabdominal ultrasound examination of the pelvis was performed including evaluation of the uterus, ovaries, adnexal regions, and pelvic cul-de-sac. COMPARISON:  None. FINDINGS: Uterus Measurements: 7.8 x 2.8 x 4.1 cm = volume: 47.3 mL. No fibroids or other mass visualized. Endometrium Thickness: 4.1.  No focal abnormality visualized. Right ovary Measurements: 5 x 2.4 x 2 cm = volume: 12.66 mL. Normal appearance/no adnexal mass. Left ovary Measurements: 2.6 x 1.6 x 2.6 cm = volume: 5.4 mL. Normal appearance/no adnexal mass. Other findings:  No abnormal free fluid. IMPRESSION: Normal pelvic ultrasound. Electronically Signed   By: Abelardo Diesel M.D.   On: 06/10/2019 18:12   Ct Abdomen Pelvis W Contrast  Result Date: 06/10/2019 CLINICAL DATA:  Abdominal pain and vaginal discharge EXAM: CT ABDOMEN AND PELVIS WITH CONTRAST TECHNIQUE: Multidetector CT imaging of the abdomen and pelvis was performed using the standard protocol following bolus administration of intravenous contrast. CONTRAST:  126mL OMNIPAQUE IOHEXOL 300 MG/ML  SOLN COMPARISON:  None. FINDINGS: Lower chest: There is slight atelectatic change in the left base. Lung bases otherwise are clear. Hepatobiliary: No focal liver lesions are evident. The gallbladder wall is not appreciably thickened. There is no biliary duct dilatation. Pancreas: There is no pancreatic mass or inflammatory focus. Spleen: No splenic lesions are evident. Adrenals/Urinary Tract: Adrenals bilaterally appear unremarkable. Kidneys bilaterally show no evident mass or hydronephrosis on either side. There is no appreciable renal or ureteral calculus on either side. Urinary bladder is midline with wall thickness within normal limits. Stomach/Bowel: Rectum is mildly distended with stool and air. There is no appreciable bowel wall or mesenteric  thickening. There is no evident bowel obstruction. The terminal ileum appears unremarkable. There is no evident free air or portal venous air. Vascular/Lymphatic: There is no abdominal aortic aneurysm. No vascular lesions are evident. No adenopathy evident in the abdomen or pelvis. Reproductive: Uterus is anteverted.  No  pelvic mass evident. Other: The appendix is prominent, measuring up to 9 mm in diameter. There is subtle enhancement of the appendiceal wall. There is no appendicolith. There is no appreciable soft tissue stranding. No fluid or abscess in this area. No evident perforation. No abscess or ascites evident in the abdomen or pelvis. Musculoskeletal: No blastic or lytic bone lesions. No intramuscular or abdominal wall lesions. IMPRESSION: 1. Appendix is mildly distended to 9 mm with mild enhancement of the appendiceal wall. These are findings which are concerning for early changes of appendicitis. There is no soft tissue stranding or fluid in the appendiceal region. No appendicoliths. These findings must be viewed as somewhat equivocal but do warrant close clinical and laboratory surveillance. If clinical symptoms so warrant, surgical consultation may be advised given these findings. Note that the appendix arises inferiorly and medially from the cecum, located fairly anterior in the upper to mid pelvis. 2. No bowel obstruction. No abscess in the abdomen or pelvis. Rectum mildly distended with stool and air. 3. No evident renal or ureteral calculus. No hydronephrosis. Urinary bladder midline with wall thickness within normal limits. Critical Value/emergent results were called by telephone at the time of interpretation on 06/10/2019 at 8:39 pm to [facility] Center[facility]IZABETH HAMMOND, PA , who verbally acknowledged these results. Electronically Signed   By: Bretta BangWilliam  Woodruff III M.D.   On: 06/10/2019 20:41    Review of Systems  Constitutional: Negative for chills, fever and malaise/fatigue.  HENT: Negative for ear  discharge, hearing loss and sore throat.   Eyes: Negative for blurred vision and discharge.  Respiratory: Negative for cough and shortness of breath.   Cardiovascular: Negative for chest pain, orthopnea and leg swelling.  Gastrointestinal: Positive for abdominal pain. Negative for constipation, diarrhea, heartburn, nausea and vomiting.  Musculoskeletal: Negative for myalgias and neck pain.  Skin: Negative for itching and rash.  Neurological: Negative for dizziness, focal weakness, seizures and loss of consciousness.  Endo/Heme/Allergies: Negative for environmental allergies. Does not bruise/bleed easily.  Psychiatric/Behavioral: Negative for depression and suicidal ideas.  All other systems reviewed and are negative.   Blood pressure 108/76, pulse 74, temperature 98.4 F (36.9 C), temperature source Oral, resp. rate 16, height 5\' 5"  (1.651 m), weight 64 kg, last menstrual period 05/29/2019, SpO2 100 %. Physical Exam  Constitutional: She is oriented to person, place, and time. Vital signs are normal. She appears well-developed and well-nourished.  Conversant No acute distress  Eyes: Lids are normal. No scleral icterus.  No lid lag Moist conjunctiva  Neck: Normal range of motion. Neck supple. No tracheal tenderness present. No thyromegaly present.  No cervical lymphadenopathy  Cardiovascular: Normal rate, regular rhythm and intact distal pulses.  No murmur heard. Respiratory: Effort normal and breath sounds normal. She has no wheezes. She has no rales.  GI: Soft. Bowel sounds are normal. There is no hepatosplenomegaly. There is abdominal tenderness. There is tenderness at McBurney's point. No hernia.  Neurological: She is alert and oriented to person, place, and time.  Normal gait and station  Skin: Skin is warm. No rash noted. No cyanosis. Nails show no clubbing.  Normal skin turgor  Psychiatric: Judgment normal.  Appropriate affect     Assessment/Plan 23 year old female with  acute appendicitis. 1.  We will admit her to the floor, n.p.o. after midnight, plan for surgery in the a.m. by Dr. Andrey CampanileWilson. 2.  Patient will undergo laparoscopic appendectomy.  Axel FillerArmando Gracynn Rajewski, MD 06/10/2019, 9:35 PM

## 2019-06-10 NOTE — ED Notes (Signed)
Mother of pt is requesting [place].

## 2019-06-10 NOTE — ED Provider Notes (Signed)
[facility] EMERGENCY DEPARTMENT Provider Note   CSN: [id number] Arrival date & time: 06/10/19  1034    History   Chief Complaint Chief Complaint  Patient presents with  . Abdominal Pain  . Vaginal Discharge    HPI Heidi Benjamin is a 23 y.o. female past medical history of dysmenorrhea presents emergency department today with chief complaint of abdominal pain and vaginal discharge x2 days.  Patient states yesterday she had sharp pain in her abdomen near her bellybutton and right lower quadrant.  It was worse with movement.  She rated the pain as 8/10 in severity.  She took Tylenol, a warm bath and used a heating pad which brought significant pain relief.  She states pain is currently 2/10 in severity.  She also noticed brown vaginal discharge when wiping. It was not heavy enough to be  She is not due for her menses for another 12 days.  She states she typically does not have spotting between cycles. The vaginal discharge has been light, she has not needed to wear sanitary napkin or use tampon. Pt states she has never been sexually active.   She denies fever, chills, flank pain, urinary symptoms, diarrhea.  Denies abdominal surgical history.  Patient states she recently lost her insurance and has not been able to follow-up with GYN.  Years ago she was diagnosed with mittelschmerz however the diagnosis was changed to dysmenorrhea as her pain occurs during menses.  History reviewed. No pertinent past medical history.  There are no active problems to display for this patient.   History reviewed. No pertinent surgical history.   OB History    Gravida  0   Para  0   Term  0   Preterm  0   AB  0   Living  0     SAB  0   TAB  0   Ectopic  0   Multiple  0   Live Births  0            Home Medications    Prior to Admission medications   Not on File    Family History No family history on file.  Social History Social History   Tobacco Use   . Smoking status: Never Smoker  . Smokeless tobacco: Never Used  Substance Use Topics  . Alcohol use: Never    Frequency: Never  . Drug use: Never     Allergies   Benzoyl peroxide and Tomato   Review of Systems Review of Systems  Constitutional: Negative for chills and fever.  HENT: Negative for congestion, ear discharge, ear pain, sinus pressure, sinus pain and sore throat.   Eyes: Negative for pain and redness.  Respiratory: Negative for cough and shortness of breath.   Cardiovascular: Negative for chest pain.  Gastrointestinal: Positive for abdominal pain. Negative for constipation, diarrhea, nausea and vomiting.  Genitourinary: Positive for vaginal bleeding and vaginal discharge. Negative for difficulty urinating, dysuria, hematuria, pelvic pain and vaginal pain.  Musculoskeletal: Negative for back pain and neck pain.  Skin: Negative for wound.  Neurological: Negative for weakness, numbness and headaches.     Physical Exam Updated Vital Signs BP 108/76   Pulse 74   Temp 98.4 F (36.9 C) (Oral)   Resp 16   Ht 5\' 5"  (1.651 m)   Wt 64 kg   LMP 05/29/2019   SpO2 100%   BMI 23.48 kg/m   Physical Exam Vitals signs and nursing note reviewed.  Constitutional:  General: She is not in acute distress.    Appearance: She is not ill-appearing.  HENT:     Head: Normocephalic and atraumatic.     Right Ear: Tympanic membrane and external ear normal.     Left Ear: Tympanic membrane and external ear normal.     Nose: Nose normal.     Mouth/Throat:     Mouth: Mucous membranes are moist.     Pharynx: Oropharynx is clear.  Eyes:     General: No scleral icterus.       Right eye: No discharge.        Left eye: No discharge.     Extraocular Movements: Extraocular movements intact.     Conjunctiva/sclera: Conjunctivae normal.     Pupils: Pupils are equal, round, and reactive to light.  Neck:     Musculoskeletal: Normal range of motion.     Vascular: No JVD.   Cardiovascular:     Rate and Rhythm: Normal rate and regular rhythm.     Pulses: Normal pulses.          Radial pulses are 2+ on the right side and 2+ on the left side.     Heart sounds: Normal heart sounds.  Pulmonary:     Comments: Lungs clear to auscultation in all fields. Symmetric chest rise. No wheezing, rales, or rhonchi. Abdominal:     Tenderness: There is no right CVA tenderness or left CVA tenderness. Negative signs include Murphy's sign, Rovsing's sign and McBurney's sign.     Comments: Abdomen is soft, non-distended.  Mild tenderness to palpation of right lower quadrant.  No rigidity, no guarding. No peritoneal signs.  Genitourinary:    Comments: Patient defers pelvic exam Musculoskeletal: Normal range of motion.  Skin:    General: Skin is warm and dry.     Capillary Refill: Capillary refill takes less than 2 seconds.  Neurological:     Mental Status: She is oriented to person, place, and time.     GCS: GCS eye subscore is 4. GCS verbal subscore is 5. GCS motor subscore is 6.     Comments: Fluent speech, no facial droop.  Psychiatric:        Behavior: Behavior normal.      ED Treatments / Results  Labs (all labs ordered are listed, but only abnormal results are displayed) Labs Reviewed  LIPASE, BLOOD  COMPREHENSIVE METABOLIC PANEL  CBC  URINALYSIS, ROUTINE W REFLEX MICROSCOPIC  I-STAT BETA HCG BLOOD, ED ([facility], [facility], [facility] ONLY)  I-STAT BETA HCG BLOOD, ED ([facility], [facility], [facility] ONLY)    EKG None  Radiology No results found.  Procedures Procedures (including critical care time)  Medications Ordered in ED Medications  sodium chloride flush (NS) 0.9 % injection 3 mL (has no administration in time range)     Initial Impression / Assessment and Plan / ED Course  I have reviewed the triage vital signs and the nursing notes.  Pertinent labs & imaging results that were available during my care of the patient were reviewed by me and considered in my medical decision making  (see chart for details).   Patient is afebrile, nontoxic, nonseptic appearing, in no apparent distress. She has mild tenderness to RLQ without peritoneal signs.  No CVA tenderness.  DDX includes ovarian cyst, irregular menses, appendicitis, biliary colic, cholecystitis.  Low suspicion for ovarian torsion patient is uncomfortable appearing.  Also unlikely to be STI as patient has never been sexually active.  CBC, CMP, lipase are unremarkable.  No leukocytosis, electrolyte abnormality, renal insufficiency. Hemoglobin stable 13.5.  Pregnancy is negative.  UA pending.  Patient declines pelvic exam.  Patient had transabdominal ultrasound 11/11/2016 that reported septated cystic mass within the left ovary is favored to be physiologic.  Given abdominal pain and right lower quadrant tenderness, will obtain pelvic ultrasound.  Patient care transferred to E. Hammond PA-C at the end of my shift. Patient presentation, ED course, and plan of care discussed with review of all pertinent labs and imaging. Please see her note for further details regarding further ED course and disposition. If [place] negative pt will likely be discharged home with gyn follow up.      Final Clinical Impressions(s) / ED Diagnoses   Final diagnoses:  None    ED Discharge Orders    None       Cherre Robins, PA-C 06/10/19 1644    Lajean Saver, MD 06/11/19 1331

## 2019-06-10 NOTE — ED Triage Notes (Signed)
Onset one day ago developed general abdominal pain and vaginal discharge brown. Pain yesterday 8/10 feels like a "gas bulp" today pain better 1/10 dull.

## 2019-06-11 ENCOUNTER — Encounter (HOSPITAL_COMMUNITY): Payer: Self-pay | Admitting: Orthopedic Surgery

## 2019-06-11 ENCOUNTER — Encounter (HOSPITAL_COMMUNITY)
Admission: EM | Disposition: A | Discharge status: Home / Self Care | Payer: Self-pay | Source: Home / Self Care | Attending: Emergency Medicine

## 2019-06-11 ENCOUNTER — Observation Stay (HOSPITAL_COMMUNITY): Payer: Medicaid Other | Admitting: Anesthesiology

## 2019-06-11 DIAGNOSIS — Z9049 Acquired absence of other specified parts of digestive tract: Secondary | ICD-10-CM

## 2019-06-11 DIAGNOSIS — Z20828 Contact with and (suspected) exposure to other viral communicable diseases: Secondary | ICD-10-CM | POA: Diagnosis not present

## 2019-06-11 DIAGNOSIS — K3580 Unspecified acute appendicitis: Secondary | ICD-10-CM | POA: Diagnosis not present

## 2019-06-11 HISTORY — PX: LAPAROSCOPIC APPENDECTOMY: SHX408

## 2019-06-11 SURGERY — APPENDECTOMY, LAPAROSCOPIC
Anesthesia: General | Site: Abdomen

## 2019-06-11 MED ORDER — ONDANSETRON HCL 4 MG/2ML IJ SOLN
INTRAMUSCULAR | Status: DC | PRN
  Administered 2019-06-11: 4 mg via INTRAVENOUS

## 2019-06-11 MED ORDER — KETOROLAC TROMETHAMINE 30 MG/ML IJ SOLN
30.0000 mg | Freq: Four times a day (QID) | INTRAMUSCULAR | Status: DC
  Administered 2019-06-11: 30 mg via INTRAVENOUS
  Filled 2019-06-11: qty 1

## 2019-06-11 MED ORDER — KETOROLAC TROMETHAMINE 30 MG/ML IJ SOLN
INTRAMUSCULAR | Status: DC | PRN
  Administered 2019-06-11: 30 mg via INTRAVENOUS

## 2019-06-11 MED ORDER — PHENYLEPHRINE 40 MCG/ML (10ML) SYRINGE FOR IV PUSH (FOR BLOOD PRESSURE SUPPORT)
PREFILLED_SYRINGE | INTRAVENOUS | Status: AC
  Filled 2019-06-11: qty 10

## 2019-06-11 MED ORDER — FENTANYL CITRATE (PF) 100 MCG/2ML IJ SOLN: 25.0000 ug | INTRAMUSCULAR | Status: DC | PRN

## 2019-06-11 MED ORDER — DIPHENHYDRAMINE HCL 50 MG/ML IJ SOLN
INTRAMUSCULAR | Status: AC
  Filled 2019-06-11: qty 1

## 2019-06-11 MED ORDER — ACETAMINOPHEN 500 MG PO TABS: 1000.0000 mg | ORAL_TABLET | Freq: Three times a day (TID) | ORAL | 0 refills | Status: AC | PRN

## 2019-06-11 MED ORDER — ONDANSETRON HCL 4 MG/2ML IJ SOLN: 4.0000 mg | Freq: Once | INTRAMUSCULAR | Status: DC | PRN

## 2019-06-11 MED ORDER — SUGAMMADEX SODIUM 200 MG/2ML IV SOLN
INTRAVENOUS | Status: DC | PRN
  Administered 2019-06-11: 256 mg via INTRAVENOUS

## 2019-06-11 MED ORDER — CELECOXIB 400 MG PO CAPS
400.0000 mg | ORAL_CAPSULE | ORAL | Status: DC
  Filled 2019-06-11: qty 1

## 2019-06-11 MED ORDER — HYDROMORPHONE HCL 1 MG/ML IJ SOLN: 0.5000 mg | INTRAMUSCULAR | Status: DC | PRN

## 2019-06-11 MED ORDER — BUPIVACAINE-EPINEPHRINE (PF) 0.25% -1:200000 IJ SOLN
INTRAMUSCULAR | Status: AC
  Filled 2019-06-11: qty 30

## 2019-06-11 MED ORDER — OXYCODONE HCL 5 MG PO TABS
5.0000 mg | ORAL_TABLET | ORAL | Status: DC | PRN
  Administered 2019-06-11 (×2): 10 mg via ORAL
  Filled 2019-06-11 (×2): qty 2

## 2019-06-11 MED ORDER — DEXAMETHASONE SODIUM PHOSPHATE 10 MG/ML IJ SOLN
INTRAMUSCULAR | Status: DC | PRN
  Administered 2019-06-11: 10 mg via INTRAVENOUS

## 2019-06-11 MED ORDER — LIDOCAINE 2% (20 MG/ML) 5 ML SYRINGE
INTRAMUSCULAR | Status: DC | PRN
  Administered 2019-06-11: 40 mg via INTRAVENOUS

## 2019-06-11 MED ORDER — ACETAMINOPHEN 500 MG PO TABS
1000.0000 mg | ORAL_TABLET | ORAL | Status: AC
  Administered 2019-06-11: 1000 mg via ORAL
  Filled 2019-06-11: qty 2

## 2019-06-11 MED ORDER — CELECOXIB 200 MG PO CAPS
400.0000 mg | ORAL_CAPSULE | ORAL | Status: AC
  Administered 2019-06-11: 400 mg via ORAL
  Filled 2019-06-11: qty 2

## 2019-06-11 MED ORDER — ENOXAPARIN SODIUM 40 MG/0.4ML ~~LOC~~ SOLN: 40.0000 mg | SUBCUTANEOUS | Status: DC

## 2019-06-11 MED ORDER — LACTATED RINGERS IV SOLN
INTRAVENOUS | Status: DC | PRN
  Administered 2019-06-11 (×2): via INTRAVENOUS

## 2019-06-11 MED ORDER — FENTANYL CITRATE (PF) 100 MCG/2ML IJ SOLN
INTRAMUSCULAR | Status: DC | PRN
  Administered 2019-06-11: 100 ug via INTRAVENOUS
  Administered 2019-06-11: 50 ug via INTRAVENOUS

## 2019-06-11 MED ORDER — KETOROLAC TROMETHAMINE 30 MG/ML IJ SOLN
INTRAMUSCULAR | Status: AC
  Filled 2019-06-11: qty 1

## 2019-06-11 MED ORDER — FENTANYL CITRATE (PF) 250 MCG/5ML IJ SOLN
INTRAMUSCULAR | Status: AC
  Filled 2019-06-11: qty 5

## 2019-06-11 MED ORDER — SODIUM CHLORIDE 0.9 % IR SOLN
Status: DC | PRN
  Administered 2019-06-11: 1000 mL

## 2019-06-11 MED ORDER — DIPHENHYDRAMINE HCL 50 MG/ML IJ SOLN
INTRAMUSCULAR | Status: DC | PRN
  Administered 2019-06-11: 12.5 mg via INTRAVENOUS

## 2019-06-11 MED ORDER — OXYCODONE HCL 5 MG PO TABS: 5.0000 mg | ORAL_TABLET | Freq: Once | ORAL | Status: DC | PRN

## 2019-06-11 MED ORDER — OXYCODONE HCL 5 MG PO TABS: 5.0000 mg | ORAL_TABLET | Freq: Four times a day (QID) | ORAL | 0 refills | Status: AC | PRN

## 2019-06-11 MED ORDER — PANTOPRAZOLE SODIUM 40 MG PO TBEC
40.0000 mg | DELAYED_RELEASE_TABLET | Freq: Every day | ORAL | Status: DC
  Administered 2019-06-11: 40 mg via ORAL
  Filled 2019-06-11: qty 1

## 2019-06-11 MED ORDER — SUCCINYLCHOLINE CHLORIDE 200 MG/10ML IV SOSY
PREFILLED_SYRINGE | INTRAVENOUS | Status: AC
  Filled 2019-06-11: qty 20

## 2019-06-11 MED ORDER — DEXAMETHASONE SODIUM PHOSPHATE 10 MG/ML IJ SOLN
INTRAMUSCULAR | Status: AC
  Filled 2019-06-11: qty 3

## 2019-06-11 MED ORDER — PROPOFOL 10 MG/ML IV BOLUS
INTRAVENOUS | Status: AC
  Filled 2019-06-11: qty 20

## 2019-06-11 MED ORDER — ONDANSETRON HCL 4 MG/2ML IJ SOLN
INTRAMUSCULAR | Status: AC
  Filled 2019-06-11: qty 4

## 2019-06-11 MED ORDER — 0.9 % SODIUM CHLORIDE (POUR BTL) OPTIME
TOPICAL | Status: DC | PRN
  Administered 2019-06-11: 1000 mL

## 2019-06-11 MED ORDER — ROCURONIUM BROMIDE 10 MG/ML (PF) SYRINGE
PREFILLED_SYRINGE | INTRAVENOUS | Status: DC | PRN
  Administered 2019-06-11: 50 mg via INTRAVENOUS

## 2019-06-11 MED ORDER — MIDAZOLAM HCL 2 MG/2ML IJ SOLN
INTRAMUSCULAR | Status: DC | PRN
  Administered 2019-06-11: 2 mg via INTRAVENOUS

## 2019-06-11 MED ORDER — GABAPENTIN 300 MG PO CAPS
300.0000 mg | ORAL_CAPSULE | ORAL | Status: AC
  Administered 2019-06-11: 300 mg via ORAL
  Filled 2019-06-11: qty 1

## 2019-06-11 MED ORDER — MIDAZOLAM HCL 2 MG/2ML IJ SOLN
INTRAMUSCULAR | Status: AC
  Filled 2019-06-11: qty 2

## 2019-06-11 MED ORDER — ACETAMINOPHEN 500 MG PO TABS
1000.0000 mg | ORAL_TABLET | Freq: Four times a day (QID) | ORAL | Status: DC
  Administered 2019-06-11 (×2): 1000 mg via ORAL
  Filled 2019-06-11 (×2): qty 2

## 2019-06-11 MED ORDER — SUCCINYLCHOLINE CHLORIDE 200 MG/10ML IV SOSY
PREFILLED_SYRINGE | INTRAVENOUS | Status: DC | PRN
  Administered 2019-06-11: 120 mg via INTRAVENOUS

## 2019-06-11 MED ORDER — DOCUSATE SODIUM 100 MG PO CAPS: 100.0000 mg | ORAL_CAPSULE | Freq: Two times a day (BID) | ORAL | 2 refills | Status: AC | PRN

## 2019-06-11 MED ORDER — LACTATED RINGERS IV SOLN
INTRAVENOUS | Status: DC
  Administered 2019-06-11: 10:00:00 via INTRAVENOUS

## 2019-06-11 MED ORDER — LIDOCAINE 2% (20 MG/ML) 5 ML SYRINGE
INTRAMUSCULAR | Status: AC
  Filled 2019-06-11: qty 10

## 2019-06-11 MED ORDER — PROPOFOL 10 MG/ML IV BOLUS
INTRAVENOUS | Status: DC | PRN
  Administered 2019-06-11: 150 mg via INTRAVENOUS

## 2019-06-11 MED ORDER — BUPIVACAINE-EPINEPHRINE 0.25% -1:200000 IJ SOLN
INTRAMUSCULAR | Status: DC | PRN
  Administered 2019-06-11: 30 mL

## 2019-06-11 MED ORDER — OXYCODONE HCL 5 MG/5ML PO SOLN: 5.0000 mg | Freq: Once | ORAL | Status: DC | PRN

## 2019-06-11 MED ORDER — ROCURONIUM BROMIDE 10 MG/ML (PF) SYRINGE
PREFILLED_SYRINGE | INTRAVENOUS | Status: AC
  Filled 2019-06-11: qty 30

## 2019-06-11 SURGICAL SUPPLY — 58 items
ADH SKN CLS APL DERMABOND .7 (GAUZE/BANDAGES/DRESSINGS)
APL PRP STRL LF DISP 70% ISPRP (MISCELLANEOUS) ×1
APL SKNCLS STERI-STRIP NONHPOA (GAUZE/BANDAGES/DRESSINGS) ×1
APPLIER CLIP ROT 10 11.4 M/L (STAPLE)
APR CLP MED LRG 11.4X10 (STAPLE)
BAG SPEC RTRVL 10 TROC 200 (ENDOMECHANICALS)
BENZOIN TINCTURE PRP APPL 2/3 (GAUZE/BANDAGES/DRESSINGS) ×2 IMPLANT
BLADE CLIPPER SURG (BLADE) IMPLANT
BNDG ADH 1X3 SHEER STRL LF (GAUZE/BANDAGES/DRESSINGS) ×3 IMPLANT
BNDG ADH THN 3X1 STRL LF (GAUZE/BANDAGES/DRESSINGS) ×1
CANISTER SUCT 3000ML PPV (MISCELLANEOUS) ×3 IMPLANT
CHLORAPREP W/TINT 26 (MISCELLANEOUS) ×3 IMPLANT
CLIP APPLIE ROT 10 11.4 M/L (STAPLE) IMPLANT
CLOSURE WOUND 1/2 X4 (GAUZE/BANDAGES/DRESSINGS) ×1
COVER SURGICAL LIGHT HANDLE (MISCELLANEOUS) ×3 IMPLANT
COVER WAND RF STERILE (DRAPES) ×3 IMPLANT
CUTTER FLEX LINEAR 45M (STAPLE) ×3 IMPLANT
DERMABOND ADVANCED (GAUZE/BANDAGES/DRESSINGS)
DERMABOND ADVANCED .7 DNX12 (GAUZE/BANDAGES/DRESSINGS) IMPLANT
DRSG TEGADERM 4X4.75 (GAUZE/BANDAGES/DRESSINGS) ×2 IMPLANT
ELECT REM PT RETURN 9FT ADLT (ELECTROSURGICAL) ×3
ELECTRODE REM PT RTRN 9FT ADLT (ELECTROSURGICAL) ×1 IMPLANT
ENDOLOOP SUT PDS II  0 18 (SUTURE)
ENDOLOOP SUT PDS II 0 18 (SUTURE) IMPLANT
GAUZE SPONGE 2X2 8PLY STRL LF (GAUZE/BANDAGES/DRESSINGS) IMPLANT
GLOVE BIOGEL M STRL SZ7.5 (GLOVE) ×3 IMPLANT
GLOVE INDICATOR 8.0 STRL GRN (GLOVE) ×6 IMPLANT
GOWN STRL REUS W/ TWL LRG LVL3 (GOWN DISPOSABLE) ×2 IMPLANT
GOWN STRL REUS W/TWL 2XL LVL3 (GOWN DISPOSABLE) ×3 IMPLANT
GOWN STRL REUS W/TWL LRG LVL3 (GOWN DISPOSABLE) ×6
GRASPER SUT TROCAR 14GX15 (MISCELLANEOUS) IMPLANT
KIT BASIN OR (CUSTOM PROCEDURE TRAY) ×3 IMPLANT
KIT TURNOVER KIT B (KITS) ×3 IMPLANT
NS IRRIG 1000ML POUR BTL (IV SOLUTION) ×3 IMPLANT
PAD ARMBOARD 7.5X6 YLW CONV (MISCELLANEOUS) ×6 IMPLANT
POUCH RETRIEVAL ECOSAC 10 (ENDOMECHANICALS) IMPLANT
POUCH RETRIEVAL ECOSAC 10MM (ENDOMECHANICALS)
RELOAD 45 VASCULAR/THIN (ENDOMECHANICALS) ×3 IMPLANT
RELOAD STAPLE 45 2.5 WHT GRN (ENDOMECHANICALS) IMPLANT
RELOAD STAPLE TA45 3.5 REG BLU (ENDOMECHANICALS) IMPLANT
SCISSORS LAP 5X35 DISP (ENDOMECHANICALS) IMPLANT
SET IRRIG TUBING LAPAROSCOPIC (IRRIGATION / IRRIGATOR) ×3 IMPLANT
SET TUBE SMOKE EVAC HIGH FLOW (TUBING) ×3 IMPLANT
SHEARS HARMONIC ACE PLUS 36CM (ENDOMECHANICALS) ×3 IMPLANT
SLEEVE ENDOPATH XCEL 5M (ENDOMECHANICALS) ×3 IMPLANT
SPECIMEN JAR SMALL (MISCELLANEOUS) ×3 IMPLANT
SPONGE GAUZE 2X2 STER 10/PKG (GAUZE/BANDAGES/DRESSINGS) ×2
STRIP CLOSURE SKIN 1/2X4 (GAUZE/BANDAGES/DRESSINGS) ×1 IMPLANT
SUT MNCRL AB 4-0 PS2 18 (SUTURE) ×3 IMPLANT
SUT VICRYL 0 UR6 27IN ABS (SUTURE) IMPLANT
TOWEL GREEN STERILE (TOWEL DISPOSABLE) ×3 IMPLANT
TOWEL GREEN STERILE FF (TOWEL DISPOSABLE) ×3 IMPLANT
TRAY FOLEY W/BAG SLVR 16FR (SET/KITS/TRAYS/PACK) ×3
TRAY FOLEY W/BAG SLVR 16FR ST (SET/KITS/TRAYS/PACK) ×1 IMPLANT
TRAY LAPAROSCOPIC MC (CUSTOM PROCEDURE TRAY) ×3 IMPLANT
TROCAR XCEL BLUNT TIP 100MML (ENDOMECHANICALS) ×3 IMPLANT
TROCAR XCEL NON-BLD 5MMX100MML (ENDOMECHANICALS) ×3 IMPLANT
WATER STERILE IRR 1000ML POUR (IV SOLUTION) ×3 IMPLANT

## 2019-06-11 NOTE — Progress Notes (Signed)
Patient admitted to unit from PACU, VSS. Patient settled in bed and oriented to unit and hospital routine. Pt resting comfortably in bed with call bell in reach and side rails up. Dressing in place to abdomen on belly button and 2 bandaids to lower abdomen and lower right clean, dry and intact. Instructed patient to utilize call bell for assistance. Will continue to monitor closely for remainder of shift.

## 2019-06-11 NOTE — Discharge Summary (Signed)
     Patient ID: Heidi Benjamin [id number] [birth year] 24 y.o.  Admit date: 06/10/2019 Discharge date: 06/11/2019  Admitting Diagnosis: Acute Appendicitis   Discharge Diagnosis Patient Active Problem List   Diagnosis Date Noted  . Status post laparoscopic appendectomy 06/11/2019  . Acute appendicitis 06/10/2019    Consultants None  Reason for Admission: Patient is a 23 year old female who comes in today secondary to 2 days of abdominal pain.  Patient states that pain began periumbilically and then slowly moved to the pelvic region.  She states that this eventually migrated to the right lower quadrant area.  Patient states that she had no nausea or vomiting.  She denies any diarrhea.  Patient states that secondary to abdominal pain she had minimal energy or movement today.  Secondary to pain she presented to the ER for further evaluation  Upon evaluation the ER she underwent CT scan and laboratory studies.  Laboratory studies revealed normal white count.  CT scan revealed a thickened appendiceal wall, no fat stranding.  I did review these personally.  General surgery was consulted for further evaluation and management.  Procedures Dr. Redmond Pulling - Laparoscopic Appendectomy, 06/11/2019  Hospital Course:  The patient was admitted and underwent a laparoscopic appendectomy.  The patient tolerated the procedure well.  On POD 0, the patient was tolerating a diet, voiding well, mobilizing, and pain was controlled with oral pain medications.  The patient was stable for DC home at this time with appropriate follow up made.  Physical Exam: Gen: Awake and alert, NAD Lungs: Normal rate and effort Abd: Soft, ND, appropriately tender around laparoscopic incisions. Umbilical incision with Tegaderm overlying. Gauze appears clean and dry.   Allergies as of 06/11/2019      Reactions   Benzoyl Peroxide Swelling, Other (See Comments)   Reaction:  Facial swelling   Tomato Nausea And Vomiting       Medication List    TAKE these medications   acetaminophen 500 MG tablet Commonly known as: TYLENOL Take 2 tablets (1,000 mg total) by mouth every 8 (eight) hours as needed.   docusate sodium 100 MG capsule Commonly known as: Colace Take 1 capsule (100 mg total) by mouth 2 (two) times daily as needed.   oxyCODONE 5 MG immediate release tablet Commonly known as: Oxy IR/ROXICODONE Take 1 tablet (5 mg total) by mouth every 6 (six) hours as needed.        Follow-up Information    Jettie Booze, NP.   Specialty: Family Medicine Why: Follow up with PCP in 2-5 days Contact information: [place] [address] [place].   Specialty: Emergency Medicine Why: return to the ER for new or worsening symptoms Contact information: [address] [id number] [place].   Contact information: [place] [phone]       Surgery, [place] Follow up on 06/24/2019.   Specialty: General Surgery Why: Follow up appointment scheduled for 10:30 AM. A provider will call you during scheduled appointment time. Please send a photo of incisions,license, and insurance card to [email or web address] the day prior to appointment.  Contact information: [place] [address] [phone]           Signed: Alferd Apa, [facility] 06/11/2019, 4:58 PM Pager: [phone]

## 2019-06-11 NOTE — Interval H&P Note (Signed)
History and Physical Interval Note:  06/11/2019 10:26 AM  [name]  has presented today for surgery, with the diagnosis of Appendicits.  The various methods of treatment have been discussed with the patient and family. After consideration of risks, benefits and other options for treatment, the patient has consented to  Procedure(s): APPENDECTOMY LAPAROSCOPIC (N/A) as a surgical intervention.  The patient's history has been reviewed, patient examined, no change in status, stable for surgery.  I have reviewed the patient's chart and labs.  Questions were answered to the patient's satisfaction.    We discussed the etiology and management of acute appendicitis. We discussed operative and nonoperative management.  I recommended operative management along with IV antibiotics.  We discussed laparoscopic appendectomy. We discussed the risk and benefits of surgery including but not limited to bleeding, infection, injury to surrounding structures, need to convert to an open procedure, blood clot formation, post operative abscess or wound infection, staple line complications such as leak or bleeding, hernia formation, post operative ileus, need for additional procedures, anesthesia complications, and the typical postoperative course. I explained that the patient should expect a good improvement in their symptoms.  Heidi Benjamin

## 2019-06-11 NOTE — Anesthesia Procedure Notes (Signed)
Procedure Name: Intubation Date/Time: 06/11/2019 11:05 AM Performed by: Leonor Liv, CRNA Pre-anesthesia Checklist: Patient identified, Emergency Drugs available, Suction available and Patient being monitored Patient Re-evaluated:Patient Re-evaluated prior to induction Oxygen Delivery Method: Circle System Utilized Preoxygenation: Pre-oxygenation with 100% oxygen Induction Type: IV induction, Rapid sequence and Cricoid Pressure applied Laryngoscope Size: Mac and 3 Grade View: Grade I Tube type: Oral Tube size: 7.0 mm Number of attempts: 1 Airway Equipment and Method: Stylet and Oral airway Placement Confirmation: ETT inserted through vocal cords under direct vision,  positive ETCO2 and breath sounds checked- equal and bilateral Secured at: 21 cm Tube secured with: Tape Dental Injury: Teeth and Oropharynx as per pre-operative assessment

## 2019-06-11 NOTE — Anesthesia Preprocedure Evaluation (Addendum)
Anesthesia Evaluation  Patient identified by MRN, date of birth, ID band Patient awake    Reviewed: Allergy & Precautions, NPO status , Patient's Chart, lab work & pertinent test results  History of Anesthesia Complications Negative for: history of anesthetic complications  Airway Mallampati: II  TM Distance: >3 FB Neck ROM: Full    Dental   Pulmonary neg pulmonary ROS,    Pulmonary exam normal        Cardiovascular negative cardio ROS Normal cardiovascular exam     Neuro/Psych negative neurological ROS  negative psych ROS   GI/Hepatic negative GI ROS, Neg liver ROS,   Endo/Other  negative endocrine ROS  Renal/GU negative Renal ROS  negative genitourinary   Musculoskeletal negative musculoskeletal ROS (+)   Abdominal   Peds  Hematology negative hematology ROS (+)   Anesthesia Other Findings   Reproductive/Obstetrics                            Anesthesia Physical Anesthesia Plan  ASA: I  Anesthesia Plan: General   Post-op Pain Management:    Induction: Intravenous and Rapid sequence  PONV Risk Score and Plan: 4 or greater and Ondansetron, Dexamethasone, Midazolam and Treatment may vary due to age or medical condition  Airway Management Planned: Oral ETT  Additional Equipment: None  Intra-op Plan:   Post-operative Plan: Extubation in OR  Informed Consent: I have reviewed the patients History and Physical, chart, labs and discussed the procedure including the risks, benefits and alternatives for the proposed anesthesia with the patient or authorized representative who has indicated his/her understanding and acceptance.     Dental advisory given  Plan Discussed with:   Anesthesia Plan Comments:        Anesthesia Quick Evaluation

## 2019-06-11 NOTE — Op Note (Signed)
Heidi Benjamin [id number] [birth year] 06/11/2019  Appendectomy, Lap, Procedure Note  Indications: The patient presented with a history of right-sided abdominal pain. A CT revealed findings consistent with acute appendicitis.  Pre-operative Diagnosis: acute appendicitis  Post-operative Diagnosis: Same - early acute  Surgeon: Greer Pickerel MD FACS  Assistants: Judyann Munson RNFA  Anesthesia: General endotracheal anesthesia  Procedure Details  The patient was seen again in the Holding Room. The risks, benefits, complications, treatment options, and expected outcomes were discussed with the patient and/or family. The possibilities of perforation of viscus, bleeding, recurrent infection, the need for additional procedures, failure to diagnose a condition, and creating a complication requiring transfusion or operation were discussed. There was concurrence with the proposed plan and informed consent was obtained. The site of surgery was properly noted. The patient was taken to Operating Room, identified as Heidi Benjamin and the procedure verified as Appendectomy. A Time Out was held and the above information confirmed.  The patient was placed in the supine position and general anesthesia was induced, along with placement of orogastric tube, SCDs. Pt voided prior to going to OR. The abdomen was prepped and draped in a sterile fashion. A 1.5 centimeter infraumbilical incision was made.  The umbilical stalk was elevated, and the midline fascia was incised with a #11 blade.  A Kelly clamp was used to confirm entrance into the peritoneal cavity.  A pursestring suture was passed around the incision with a 0 Vicryl.  A 85mm Hasson was introduced into the abdomen and the tails of the suture were used to hold the Hasson in place.   The pneumoperitoneum was then established to steady pressure of 15 mmHg.  Additional 5 mm cannulas then placed in the left lower quadrant of the abdomen and the suprapubic region under  direct visualization. A careful evaluation of the entire abdomen was carried out. The patient was placed in Trendelenburg and left lateral decubitus position. The small intestines were retracted in the cephalad and left lateral direction away from the pelvis and right lower quadrant. The patient was found to have an inflammed appendix that was extending into the pelvis. There was no evidence of perforation. Gross inspection of the pelvis revealed normal appearing uterus/tubes& ovaries.   The appendix was carefully dissected. The appendix was was skeletonized with the harmonic scalpel.   The appendix was divided at its base using an endo-GIA stapler with a white load. No appendiceal stump was left in place. The appendix was removed from the abdomen with an Endocatch bag through the umbilical port.  There was no evidence of bleeding, leakage, or complication after division of the appendix. Irrigation was also performed and irrigate suctioned from the abdomen as well.  The umbilical port site was closed with the purse string suture. The closure was viewed laparoscopically. There was no residual palpable fascial defect.  The trocar site skin wounds were closed with 4-0 Monocryl. Benzoin, steri-strips and bandages were applied to the skin incisions.  Instrument, sponge, and needle counts were correct at the conclusion of the case.   Findings: The appendix was found to be inflamed. There were not signs of necrosis.  There was not perforation. There was not abscess formation.  Estimated Blood Loss:  Minimal         Drains: none         Specimens: appendix         Complications:  None; patient tolerated the procedure well.         Disposition: PACU -  hemodynamically stable.         Condition: stable  Mary SellaEric M. Andrey CampanileWilson, MD, FACS General, Bariatric, & Minimally Invasive Surgery [facility], [place]

## 2019-06-11 NOTE — Anesthesia Postprocedure Evaluation (Signed)
Anesthesia Post Note  Patient: Heidi Benjamin  Procedure(s) Performed: APPENDECTOMY LAPAROSCOPIC (N/A Abdomen)     Patient location during evaluation: PACU Anesthesia Type: General Level of consciousness: awake and alert Pain management: pain level controlled Vital Signs Assessment: post-procedure vital signs reviewed and stable Respiratory status: spontaneous breathing, nonlabored ventilation and respiratory function stable Cardiovascular status: blood pressure returned to baseline and stable Postop Assessment: no apparent nausea or vomiting Anesthetic complications: no    Last Vitals:  Vitals:   06/11/19 1214 06/11/19 1215  BP: 126/72   Pulse: (!) 105   Resp: 18   Temp:  36.7 C  SpO2: 100%     Last Pain:  Vitals:   06/11/19 1215  TempSrc:   PainSc: Asleep                 Lidia Collum

## 2019-06-11 NOTE — Transfer of Care (Signed)
Immediate Anesthesia Transfer of Care Note  Patient: Heidi Benjamin  Procedure(s) Performed: APPENDECTOMY LAPAROSCOPIC (N/A Abdomen)  Patient Location: PACU  Anesthesia Type:General  Level of Consciousness: awake, alert  and oriented  Airway & Oxygen Therapy: Patient Spontanous Breathing and Patient connected to nasal cannula oxygen  Post-op Assessment: Report given to RN, Post -op Vital signs reviewed and stable and Patient moving all extremities  Post vital signs: Reviewed and stable  Last Vitals:  Vitals Value Taken Time  BP 126/72 06/11/19 1214  Temp    Pulse 85 06/11/19 1217  Resp 16 06/11/19 1217  SpO2 100 % 06/11/19 1217  Vitals shown include unvalidated device data.  Last Pain:  Vitals:   06/11/19 0735  TempSrc:   PainSc: 2          Complications: No apparent anesthesia complications

## 2019-06-12 ENCOUNTER — Encounter (HOSPITAL_COMMUNITY): Payer: Self-pay | Admitting: General Surgery

## 2019-08-19 ENCOUNTER — Other Ambulatory Visit: Payer: Self-pay

## 2019-08-19 DIAGNOSIS — Z20822 Contact with and (suspected) exposure to covid-19: Secondary | ICD-10-CM

## 2019-08-21 LAB — NOVEL CORONAVIRUS, NAA: SARS-CoV-2, NAA: DETECTED — AB

## 2019-09-26 IMAGING — CT CT ABDOMEN AND PELVIS WITH CONTRAST
2 of 4 series · 15 of 46 positions shown, 17 images · IV contrast (omnipaque)
Comparison: None.

CLINICAL DATA: Abdominal pain and vaginal discharge

EXAM:
CT ABDOMEN AND PELVIS WITH CONTRAST
TECHNIQUE: Multidetector CT imaging of the abdomen and pelvis was performed
using the standard protocol following bolus administration of
intravenous contrast.
CONTRAST:  100mL OMNIPAQUE IOHEXOL 300 MG/ML  SOLN

[Series 3: a/p w/ 5mm · axial · 0.82mm/px · z∈[+667,+1052]mm · 12 of 93 slices shown, 14 images]
[im 8/93  soft-tissue]
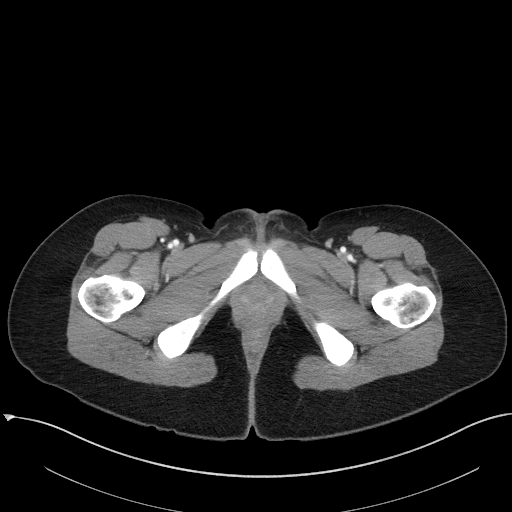
[im 8/93  bone]
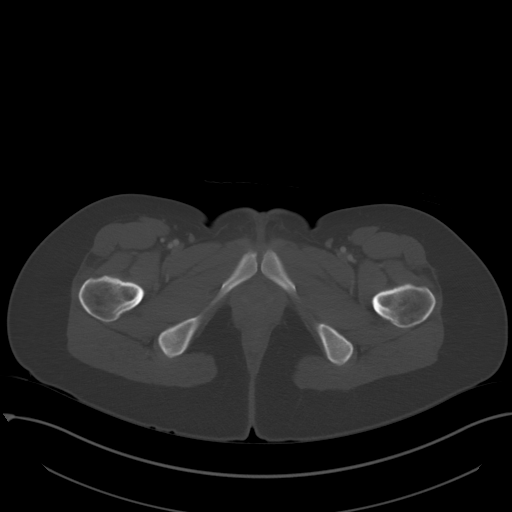
[im 15/93  soft-tissue]
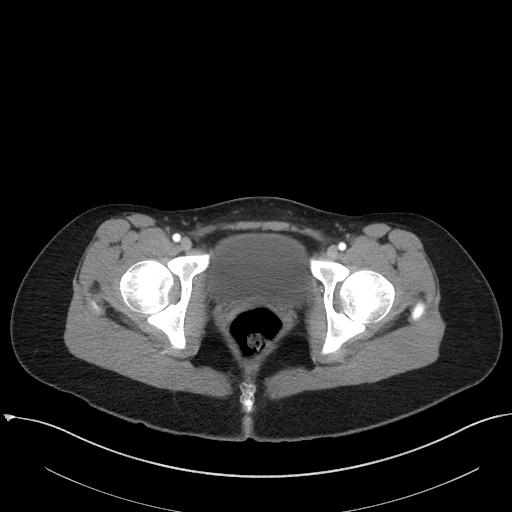
[im 22/93  soft-tissue]
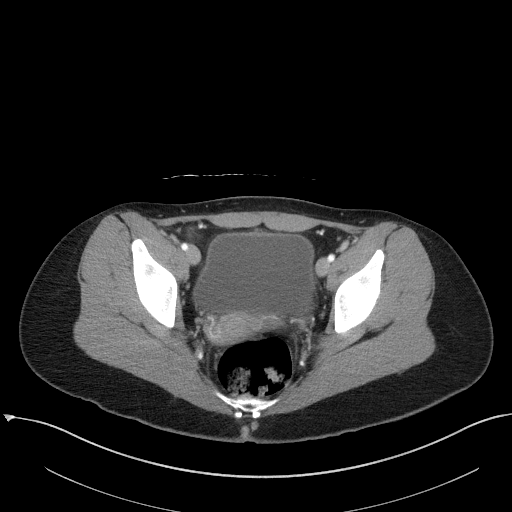
[im 29/93  soft-tissue]
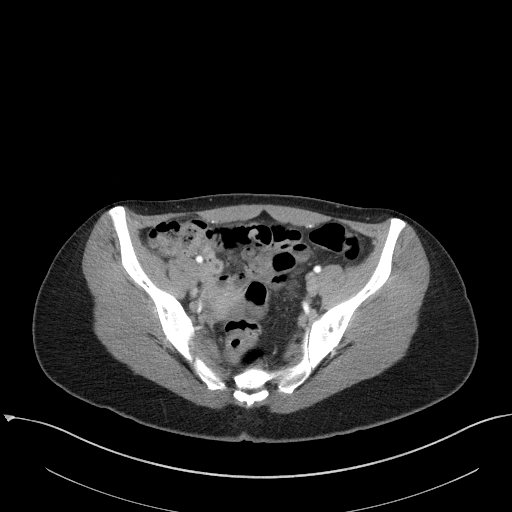
[im 36/93  soft-tissue]
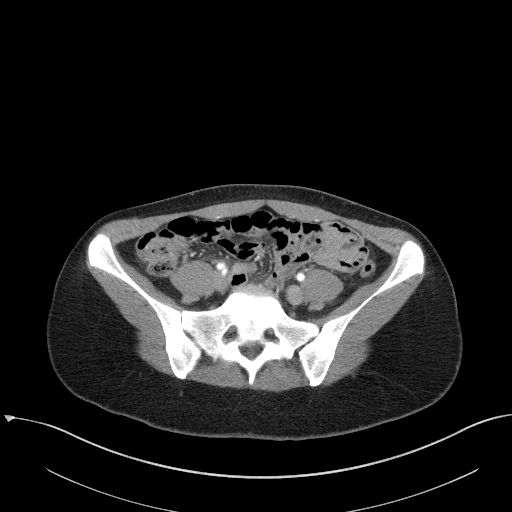
[im 43/93  soft-tissue]
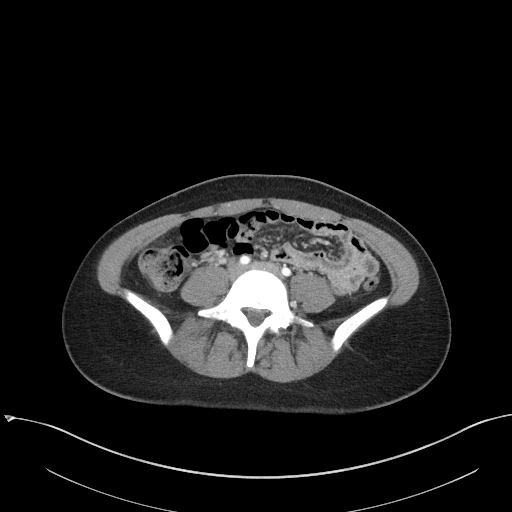
[im 50/93  soft-tissue]
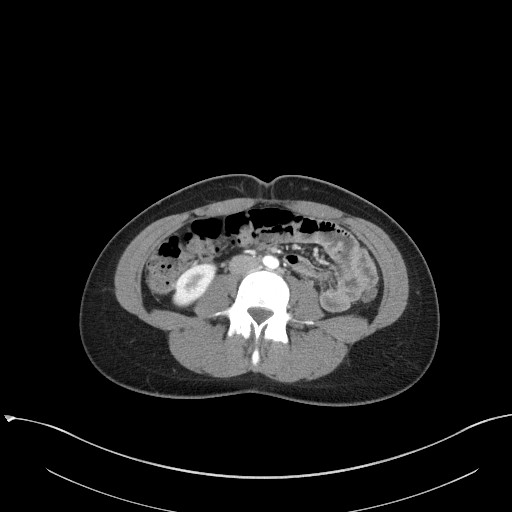
[im 57/93  soft-tissue]
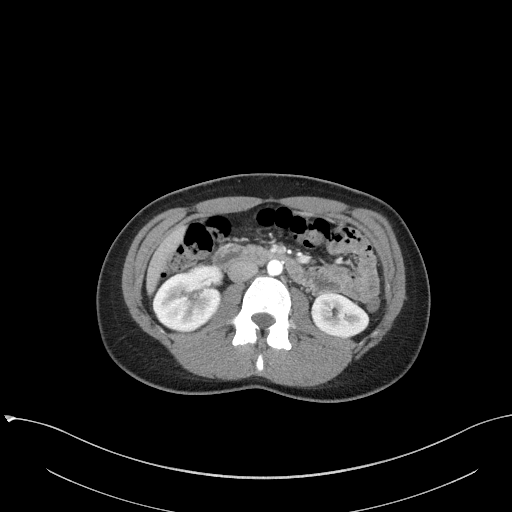
[im 64/93  soft-tissue]
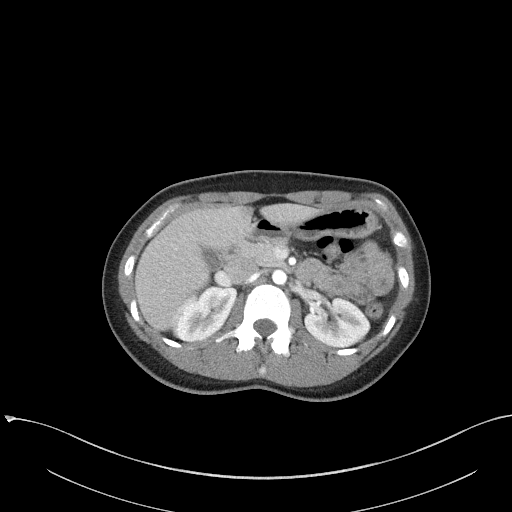
[im 64/93  bone]
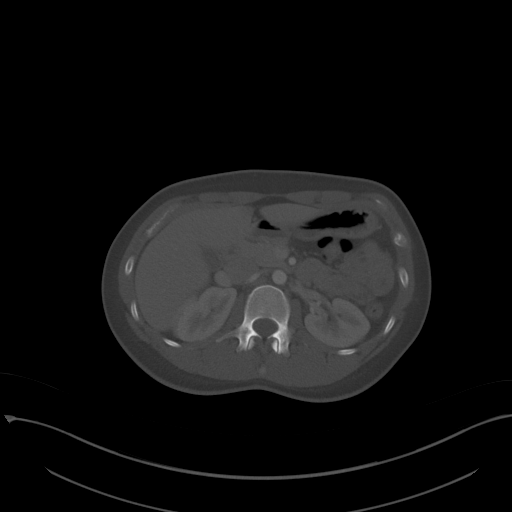
[im 71/93  soft-tissue]
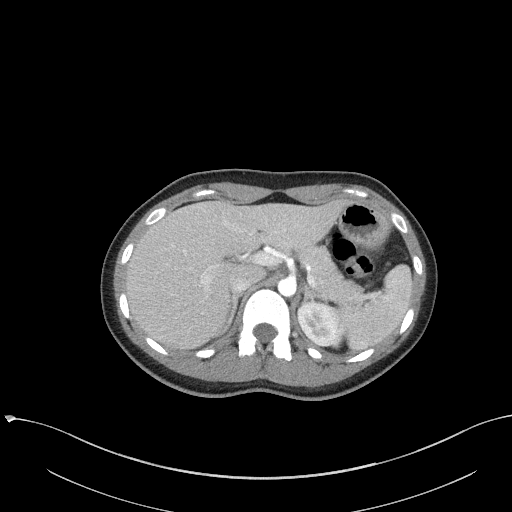
[im 78/93  soft-tissue]
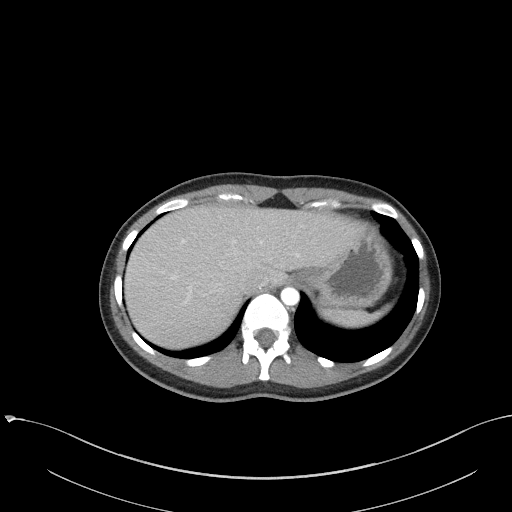
[im 85/93  soft-tissue]
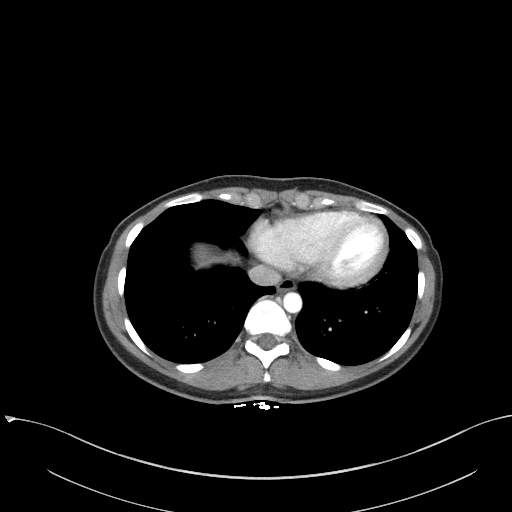

[Series 6: a/p w/ cor · coronal · 0.74mm/px · 3 of 128 slices shown]
[im 43/128  soft-tissue]
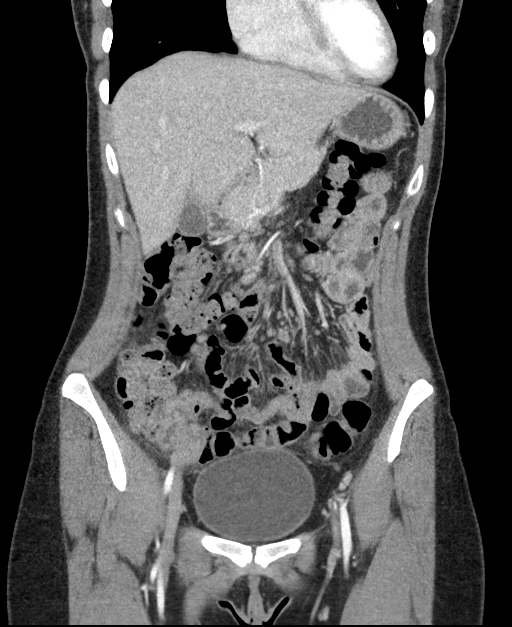
[im 57/128  soft-tissue]
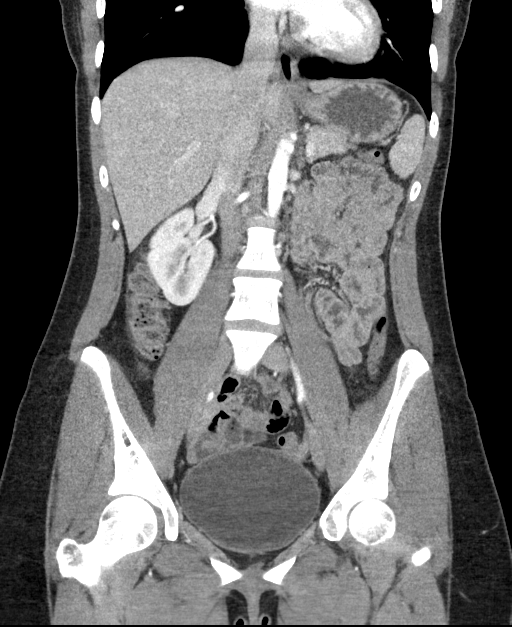
[im 71/128  soft-tissue]
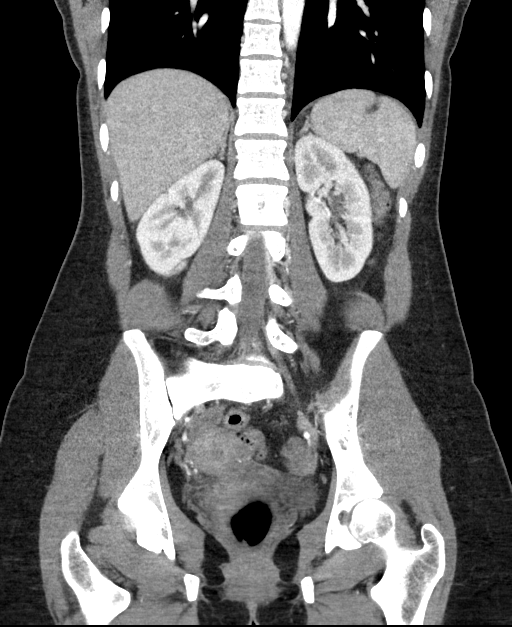

[15 of 46 positions shown; findings below may reference images not displayed]

FINDINGS: Lower chest: There is slight atelectatic change in the left base.
Lung bases otherwise are clear.

Hepatobiliary: No focal liver lesions are evident. The gallbladder
wall is not appreciably thickened. There is no biliary duct
dilatation.

Pancreas: There is no pancreatic mass or inflammatory focus.

Spleen: No splenic lesions are evident.

Adrenals/Urinary Tract: Adrenals bilaterally appear unremarkable.
Kidneys bilaterally show no evident mass or hydronephrosis on either
side. There is no appreciable renal or ureteral calculus on either
side. Urinary bladder is midline with wall thickness within normal
limits.

Stomach/Bowel: Rectum is mildly distended with stool and air. There
is no appreciable bowel wall or mesenteric thickening. There is no
evident bowel obstruction. The terminal ileum appears unremarkable.
There is no evident free air or portal venous air.

Vascular/Lymphatic: There is no abdominal aortic aneurysm. No
vascular lesions are evident. No adenopathy evident in the abdomen
or pelvis.

Reproductive: Uterus is anteverted.  No pelvic mass evident.

Other: The appendix is prominent, measuring up to 9 mm in diameter.
There is subtle enhancement of the appendiceal wall. There is no
appendicolith. There is no appreciable soft tissue stranding. No
fluid or abscess in this area. No evident perforation.

No abscess or ascites evident in the abdomen or pelvis.

Musculoskeletal: No blastic or lytic bone lesions. No intramuscular
or abdominal wall lesions.
IMPRESSION: 1. Appendix is mildly distended to 9 mm with mild enhancement of the
appendiceal wall. These are findings which are concerning for early
changes of appendicitis. There is no soft tissue stranding or fluid
in the appendiceal region. No appendicoliths. These findings must be
viewed as somewhat equivocal but do warrant close clinical and
laboratory surveillance. If clinical symptoms so warrant, surgical
consultation may be advised given these findings. Note that the
appendix arises inferiorly and medially from the cecum, located
fairly anterior in the upper to mid pelvis.

2. No bowel obstruction. No abscess in the abdomen or pelvis. Rectum
mildly distended with stool and air.

3. No evident renal or ureteral calculus. No hydronephrosis. Urinary
bladder midline with wall thickness within normal limits.

Critical Value/emergent results were called by telephone at the time
of interpretation on 06/10/2019 at [DATE] to KLIMAJ SAFRANEK, PA ,
who verbally acknowledged these results.

## 2019-09-26 IMAGING — US US PELVIS COMPLETE
1 series · 14 of 25 positions shown · non-contrast
Comparison: None.

CLINICAL DATA: Right lower quadrant abdomen pain for 2 days

EXAM:
TRANSABDOMINAL ULTRASOUND OF PELVIS
TECHNIQUE: Transabdominal ultrasound examination of the pelvis was performed
including evaluation of the uterus, ovaries, adnexal regions, and
pelvic cul-de-sac.

[Series 1: us pelvis complete · 27 acquisitions, 14 frames shown]
[im 1/27]
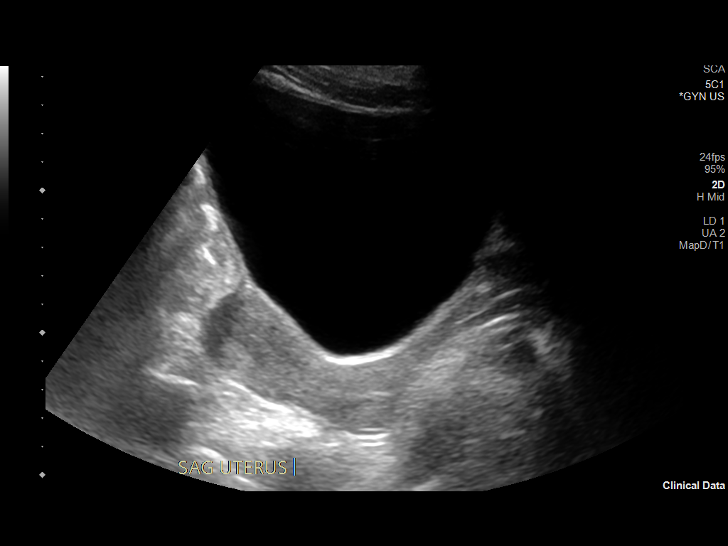
[im 3/27]
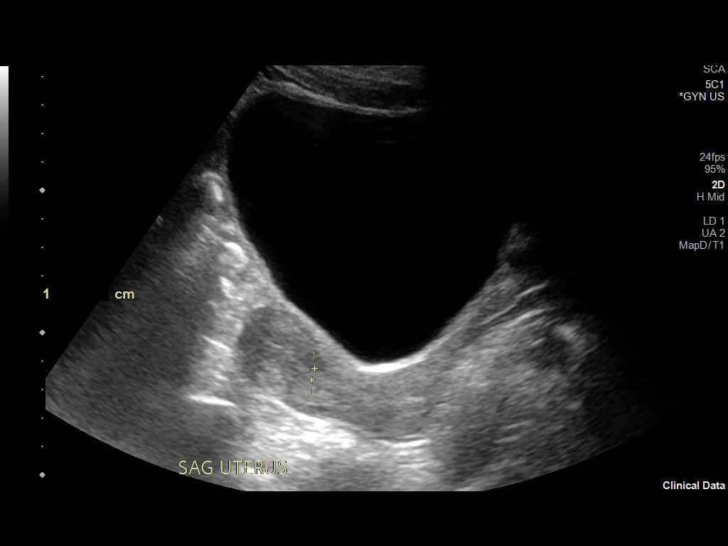
[im 5/27]
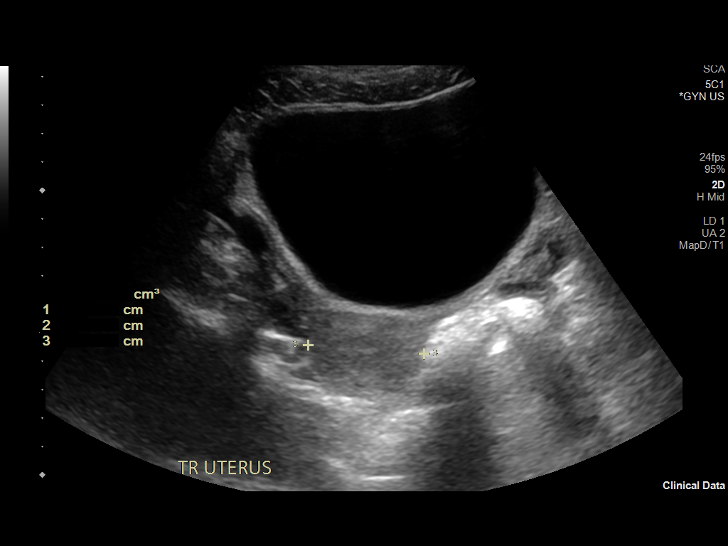
[im 7/27]
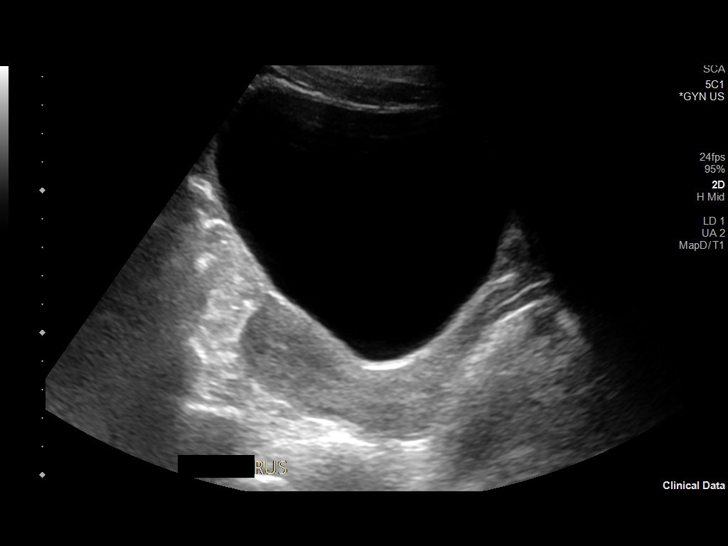
[im 9/27]
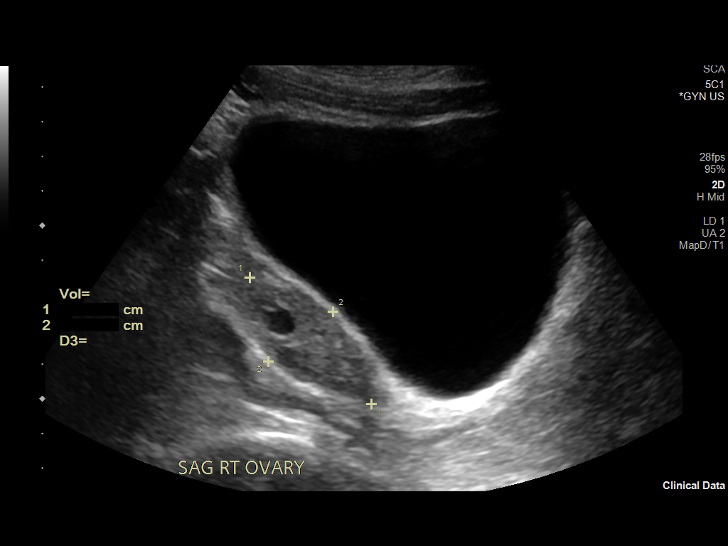
[im 10/27]
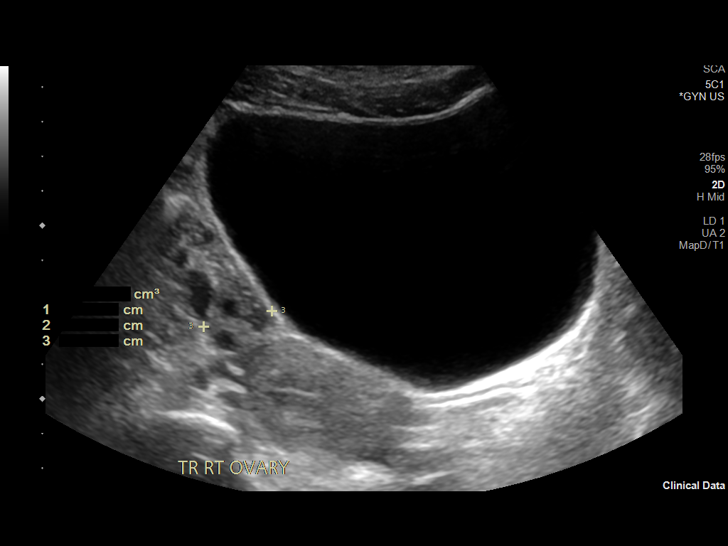
[im 12/27]
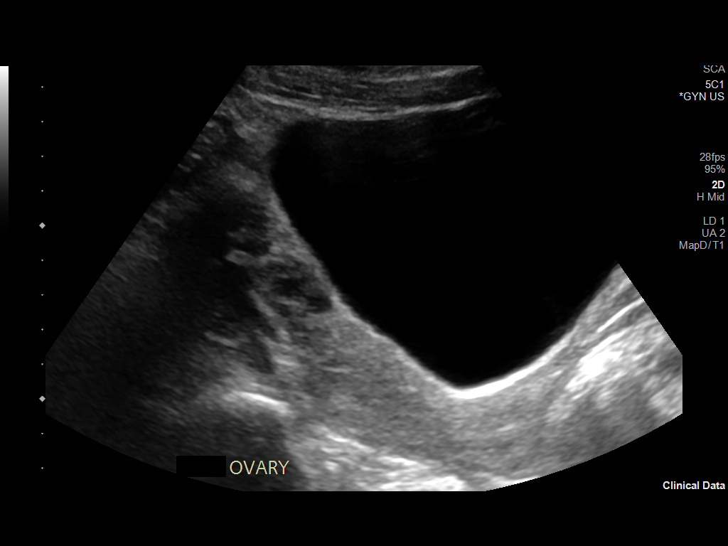
[im 15/27]
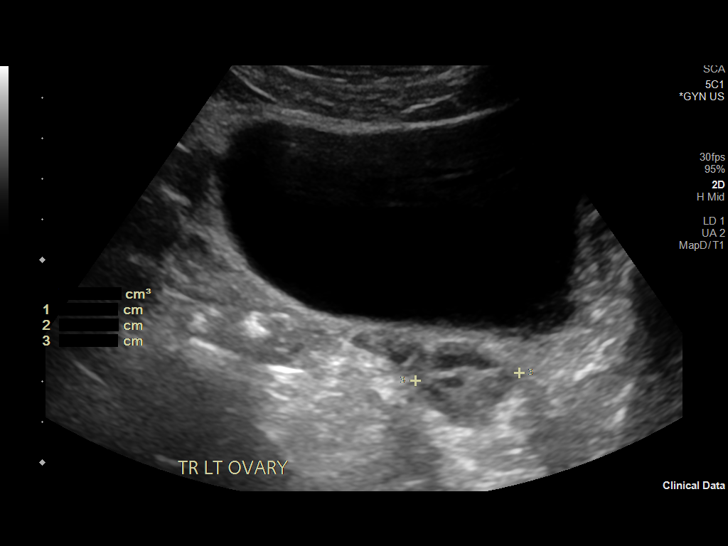
[im 17/27]
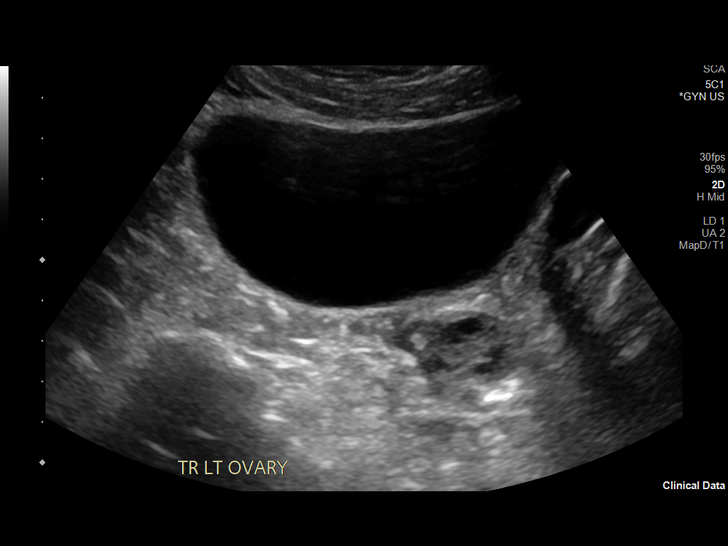
[im 18/27]
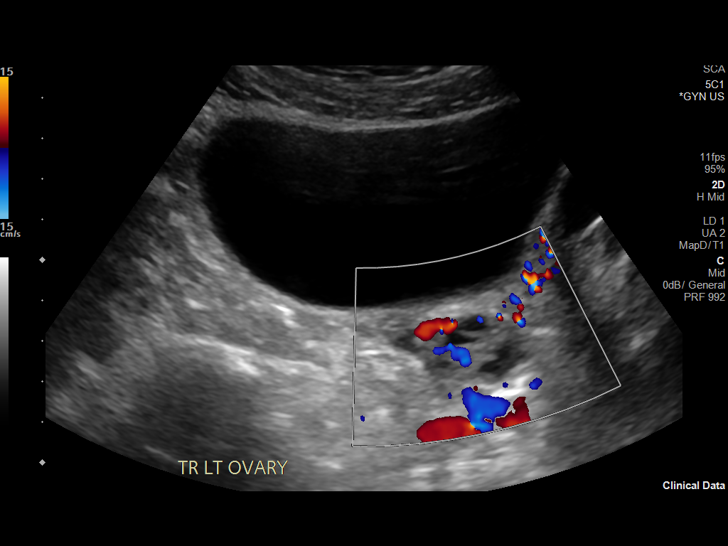
[im 20/27]
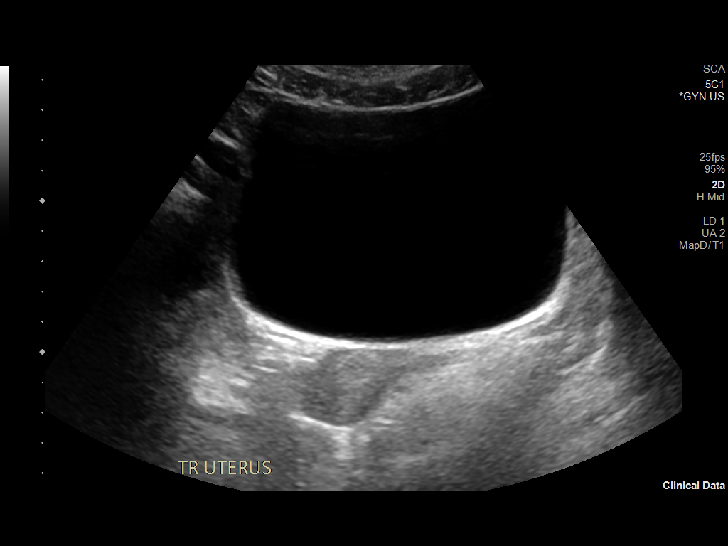
[im 22/27]
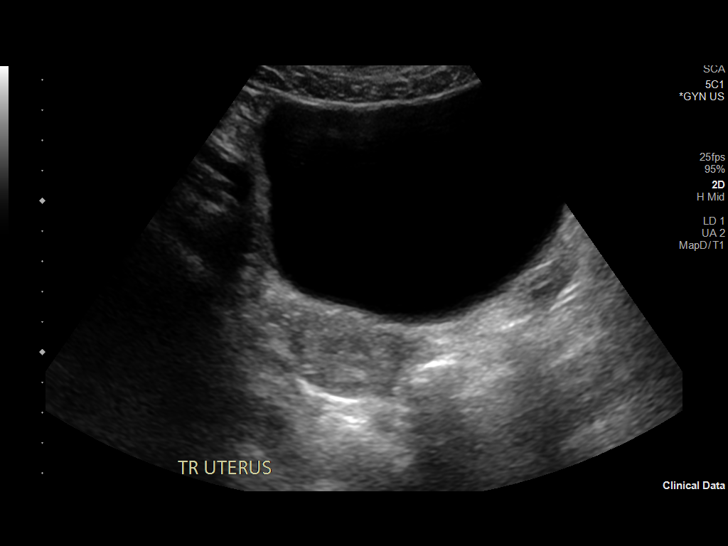
[im 24/27]
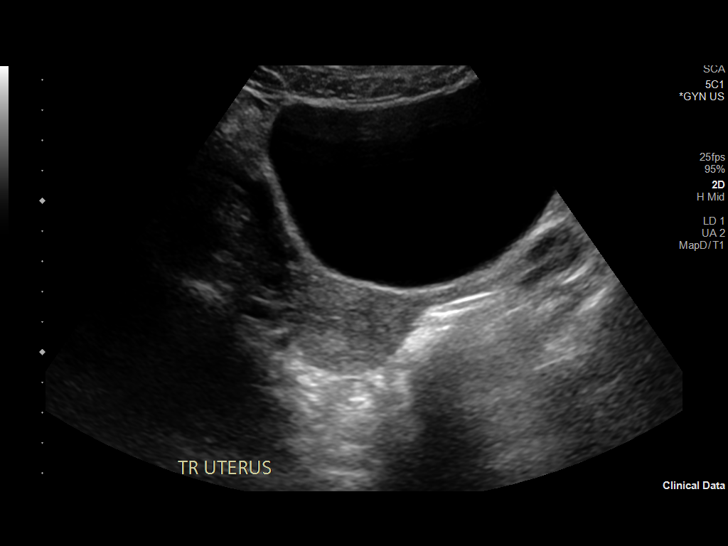
[im 27/27]
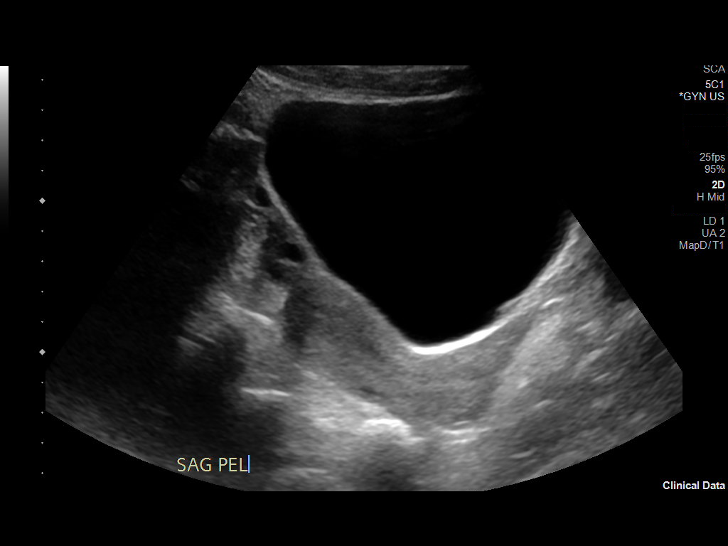

[14 of 25 positions shown; findings below may reference images not displayed]

FINDINGS: Uterus

Measurements: 7.8 x 2.8 x 4.1 cm = volume: 47.3 mL. No fibroids or
other mass visualized.

Endometrium

Thickness: 4.1.  No focal abnormality visualized.

Right ovary

Measurements: 5 x 2.4 x 2 cm = volume: 12.66 mL. Normal
appearance/no adnexal mass.

Left ovary

Measurements: 2.6 x 1.6 x 2.6 cm = volume: 5.4 mL. Normal
appearance/no adnexal mass.

Other findings:  No abnormal free fluid.
IMPRESSION: Normal pelvic ultrasound.

## 2022-10-23 ENCOUNTER — Emergency Department (HOSPITAL_BASED_OUTPATIENT_CLINIC_OR_DEPARTMENT_OTHER)
Admission: EM | Admit: 2022-10-23 | Discharge: 2022-10-23 | Disposition: A | Discharge status: Home / Self Care | Payer: 59 | Attending: Emergency Medicine | Admitting: Emergency Medicine

## 2022-10-23 ENCOUNTER — Other Ambulatory Visit: Payer: Self-pay

## 2022-10-23 ENCOUNTER — Encounter (HOSPITAL_BASED_OUTPATIENT_CLINIC_OR_DEPARTMENT_OTHER): Payer: Self-pay | Admitting: Emergency Medicine

## 2022-10-23 ENCOUNTER — Emergency Department (HOSPITAL_BASED_OUTPATIENT_CLINIC_OR_DEPARTMENT_OTHER): Payer: 59 | Admitting: Radiology

## 2022-10-23 DIAGNOSIS — Z1152 Encounter for screening for COVID-19: Secondary | ICD-10-CM | POA: Insufficient documentation

## 2022-10-23 DIAGNOSIS — R059 Cough, unspecified: Secondary | ICD-10-CM | POA: Insufficient documentation

## 2022-10-23 DIAGNOSIS — R0982 Postnasal drip: Secondary | ICD-10-CM | POA: Diagnosis not present

## 2022-10-23 LAB — RESP PANEL BY RT-PCR (FLU A&B, COVID) ARPGX2
Influenza A by PCR: NEGATIVE
Influenza B by PCR: NEGATIVE
SARS Coronavirus 2 by RT PCR: NEGATIVE

## 2022-10-23 MED ORDER — FLUTICASONE PROPIONATE 50 MCG/ACT NA SUSP: 2.0000 | Freq: Every day | NASAL | 2 refills | Status: AC

## 2022-10-23 NOTE — ED Triage Notes (Signed)
Cough for about 2 weeks, low energy,no fevers , cough dry, not congested.

## 2022-10-23 NOTE — ED Notes (Signed)
Dc instructions reviewed with patient. Patient voiced understanding. Dc with belongings.  °

## 2022-10-23 NOTE — Discharge Instructions (Signed)
You are seen today in the emergency department due to cough.  I suspect you have postnasal drip which is likely being caused by allergies.  Take the Flonase twice daily for a week.  Use do this by injecting inside each nare twice a day.  You may take a week to notice improvement.  He can continue taking the Benadryl but take only at night as this will make you sleepy.  Return to the ED for new or concerning symptoms.  Follow-up with your eye doctor for reevaluation in about a week.

## 2022-10-23 NOTE — ED Provider Notes (Signed)
[facility] EMERGENCY DEPT Provider Note   CSN: [id number] Arrival date & time: 10/23/22  1600     History  Chief Complaint  Patient presents with   Cough    Heidi Benjamin is a 26 y.o. female.   Cough    Patient presents due to nonproductive cough x 2 weeks.  It was initially show today with nasal congestion but she took Benadryl and that improved the symptom.  She denies any fevers at home, body aches, generalized chills.  She does have a history of seasonal allergies.  No chest pain, does not feel short of breath.  Home Medications Prior to Admission medications   Medication Sig Start Date End Date Taking? Authorizing Provider  acetaminophen (TYLENOL) 500 MG tablet Take 2 tablets (1,000 mg total) by mouth every 8 (eight) hours as needed. 06/11/19   Maczis, Elmer Sow, PA-C  oxyCODONE (OXY IR/ROXICODONE) 5 MG immediate release tablet Take 1 tablet (5 mg total) by mouth every 6 (six) hours as needed. 06/11/19   Maczis, Elmer Sow, PA-C      Allergies    Benzoyl peroxide and Tomato    Review of Systems   Review of Systems  Respiratory:  Positive for cough.     Physical Exam Updated Vital Signs BP 129/83 (BP Location: Right Arm)   Pulse 69   Temp 98.1 F (36.7 C)   Resp 16   SpO2 100%  Physical Exam Vitals and nursing note reviewed. Exam conducted with a chaperone present.  Constitutional:      Appearance: Normal appearance.  HENT:     Head: Normocephalic and atraumatic.     Nose:     Comments: Pale boggy nasal turbinates bilaterally    Mouth/Throat:     Comments: Postnasal drip Eyes:     General: No scleral icterus.       Right eye: No discharge.        Left eye: No discharge.     Extraocular Movements: Extraocular movements intact.     Pupils: Pupils are equal, round, and reactive to light.  Cardiovascular:     Rate and Rhythm: Normal rate and regular rhythm.     Pulses: Normal pulses.     Heart sounds: Normal heart sounds. No murmur heard.     No friction rub. No gallop.  Pulmonary:     Effort: Pulmonary effort is normal. No respiratory distress.     Breath sounds: Normal breath sounds.  Abdominal:     General: Abdomen is flat. Bowel sounds are normal. There is no distension.     Palpations: Abdomen is soft.     Tenderness: There is no abdominal tenderness.  Skin:    General: Skin is warm and dry.     Coloration: Skin is not jaundiced.  Neurological:     Mental Status: She is alert. Mental status is at baseline.     Coordination: Coordination normal.     ED Results / Procedures / Treatments   Labs (all labs ordered are listed, but only abnormal results are displayed) Labs Reviewed  RESP PANEL BY RT-PCR (FLU A&B, COVID) [id number]    EKG None  Radiology No results found.  Procedures Procedures    Medications Ordered in ED Medications - No data to display  ED Course/ Medical Decision Making/ A&P Clinical Course as of 10/23/22 1756  Mon Oct 23, 2022  1755 DG Chest 2 View Negative for acute process  [HS]  1755 Resp Panel by RT-PCR (Flu A&B, Covid)  Anterior Nasal Swab [HS]    Clinical Course User Index [HS] Theron Arista, PA-C                           Medical Decision Making Amount and/or Complexity of Data Reviewed Labs:  Decision-making details documented in ED Course. Radiology: ordered. Decision-making details documented in ED Course.   Patient presents due to cough x 2 weeks.  Overall constellation of symptoms seems to be suggestive of an allergic process.  Chest x-ray was negative for pneumonia, ordered given prolonged nature of the cough.  She is also negative for COVID and flu.  Will send home with Flonase and encourage second-generation antihistamine usage.  Precautions were discussed with patient verbalized understand agreement with plan.  Denies any patient for additional workup at this time.  Stable for discharge.        Final Clinical Impression(s) / ED Diagnoses Final diagnoses:  None     Rx / DC Orders ED Discharge Orders     None         Theron Arista, PA-C 10/23/22 1756    Glyn Ade, MD 10/23/22 1807

## 2023-01-15 DIAGNOSIS — M79641 Pain in right hand: Secondary | ICD-10-CM | POA: Diagnosis not present

## 2023-01-15 DIAGNOSIS — M778 Other enthesopathies, not elsewhere classified: Secondary | ICD-10-CM | POA: Diagnosis not present

## 2023-06-26 ENCOUNTER — Other Ambulatory Visit: Payer: Self-pay

## 2023-06-26 ENCOUNTER — Emergency Department (HOSPITAL_BASED_OUTPATIENT_CLINIC_OR_DEPARTMENT_OTHER)
Admission: EM | Admit: 2023-06-26 | Discharge: 2023-06-26 | Disposition: A | Discharge status: Home / Self Care | Payer: 59 | Attending: Emergency Medicine | Admitting: Emergency Medicine

## 2023-06-26 ENCOUNTER — Encounter (HOSPITAL_BASED_OUTPATIENT_CLINIC_OR_DEPARTMENT_OTHER): Payer: Self-pay

## 2023-06-26 DIAGNOSIS — H6122 Impacted cerumen, left ear: Secondary | ICD-10-CM | POA: Insufficient documentation

## 2023-06-26 DIAGNOSIS — H9202 Otalgia, left ear: Secondary | ICD-10-CM

## 2023-06-26 MED ORDER — CIPROFLOXACIN-DEXAMETHASONE 0.3-0.1 % OT SUSP: 4.0000 [drp] | Freq: Two times a day (BID) | OTIC | 0 refills | Status: AC

## 2023-06-26 MED ORDER — CEFDINIR 300 MG PO CAPS: 300.0000 mg | ORAL_CAPSULE | Freq: Two times a day (BID) | ORAL | 0 refills | Status: AC

## 2023-06-26 NOTE — Discharge Instructions (Addendum)
As discussed, given appearance of your outer ear, will prescribe antibiotic eardrops.  Given that we cannot see your eardrum completely while emergency department, will also place on antibiotic pills for concern for inner ear infection.  You may continue at home ear rinses with diluted peroxide.  Recommend follow-up with primary care in the outpatient setting for reevaluation.  Will also touch ENT information if symptoms persist.  Please do not hesitate to return to emergency department for worrisome signs and symptoms we discussed to become apparent.

## 2023-06-26 NOTE — ED Provider Notes (Signed)
[place] EMERGENCY DEPARTMENT AT [facility] Provider Note   CSN: [id number] Arrival date & time: 06/26/23  1518     History  Chief Complaint  Patient presents with   Otalgia    Heidi Benjamin is a 27 y.o. female.   Otalgia   26 year old female presents emergency department with complaints of left-sided ear pain.  Patient states he has had left-sided ear pain for the past 3 to 4 days.  Reports she had ear infection 2 years ago and this feels similar.  States has been trying at home diluted peroxide mixtures to help rinse out her ear as she feel like she has had buildup of earwax.  Reports some decreased hearing in affected ear.  Denies any fever, chills, cough, congestion, sore throat.  No significant pertinent past medical history.  Home Medications Prior to Admission medications   Medication Sig Start Date End Date Taking? Authorizing Provider  cefdinir (OMNICEF) 300 MG capsule Take 1 capsule (300 mg total) by mouth 2 (two) times daily. 06/26/23  Yes Sherian Maroon A, PA  ciprofloxacin-dexamethasone (CIPRODEX) OTIC suspension Place 4 drops into the left ear 2 (two) times daily for 10 days. 06/26/23 07/06/23 Yes Sherian Maroon A, PA  acetaminophen (TYLENOL) 500 MG tablet Take 2 tablets (1,000 mg total) by mouth every 8 (eight) hours as needed. 06/11/19   Maczis, Elmer Sow, PA-C  fluticasone (FLONASE) 50 MCG/ACT nasal spray Place 2 sprays into both nostrils daily. 10/23/22   Theron Arista, PA-C  oxyCODONE (OXY IR/ROXICODONE) 5 MG immediate release tablet Take 1 tablet (5 mg total) by mouth every 6 (six) hours as needed. 06/11/19   Maczis, Elmer Sow, PA-C      Allergies    Benzoyl peroxide and Tomato    Review of Systems   Review of Systems  HENT:  Positive for ear pain.   All other systems reviewed and are negative.   Physical Exam Updated Vital Signs BP 126/85   Pulse 73   Temp 98.3 F (36.8 C)   Resp 12   Ht 5\' 5"  (1.651 m)   Wt 74.4 kg   SpO2 99%   BMI  27.29 kg/m  Physical Exam Vitals and nursing note reviewed.  Constitutional:      General: She is not in acute distress.    Appearance: She is well-developed.  HENT:     Head: Normocephalic and atraumatic.     Right Ear: Tympanic membrane, ear canal and external ear normal.     Ears:     Comments: Patient with cerumen impaction in left ear with attempted removal multiple times.  Resulting external auditory canal with erythema as well as some maceration and minimal discharge.  Nonabsorbable TM postprocedure.    Nose: Nose normal.     Mouth/Throat:     Mouth: Mucous membranes are moist.     Pharynx: Oropharynx is clear.  Eyes:     Conjunctiva/sclera: Conjunctivae normal.  Cardiovascular:     Rate and Rhythm: Normal rate and regular rhythm.     Heart sounds: No murmur heard. Pulmonary:     Effort: Pulmonary effort is normal. No respiratory distress.     Breath sounds: Normal breath sounds.  Abdominal:     Palpations: Abdomen is soft.     Tenderness: There is no abdominal tenderness.  Musculoskeletal:        General: No swelling.     Cervical back: Neck supple.  Skin:    General: Skin is warm and dry.  Capillary Refill: Capillary refill takes less than 2 seconds.  Neurological:     Mental Status: She is alert.  Psychiatric:        Mood and Affect: Mood normal.     ED Results / Procedures / Treatments   Labs (all labs ordered are listed, but only abnormal results are displayed) Labs Reviewed - No data to display  EKG None  Radiology No results found.  Procedures .Ear Cerumen Removal  Date/Time: 06/26/2023 5:46 PM  Performed by: Peter Garter, PA Authorized by: Peter Garter, PA   Consent:    Consent obtained:  Verbal   Consent given by:  Patient   Risks, benefits, and alternatives were discussed: yes     Risks discussed:  Bleeding, dizziness, incomplete removal, infection, pain and TM perforation   Alternatives discussed:  No treatment, delayed  treatment, alternative treatment, observation and referral Universal protocol:    Procedure explained and questions answered to patient or proxy's satisfaction: yes     Relevant documents present and verified: yes     Patient identity confirmed:  Verbally with patient and arm band Procedure details:    Location:  L ear   Procedure type: curette     Procedure outcomes: cerumen removed (Cerumen partially removed)   Post-procedure details:    Inspection:  No bleeding, some cerumen remaining and macerated skin   Hearing quality:  Improved   Procedure completion:  Tolerated well, no immediate complications     Medications Ordered in ED Medications - No data to display  ED Course/ Medical Decision Making/ A&P                                 Medical Decision Making Risk Prescription drug management.   This patient presents to the ED for concern of ear pain, this involves an extensive number of treatment options, and is a complaint that carries with it a high risk of complications and morbidity.  The differential diagnosis includes otitis media, otitis externa, perforated TM, cellulitis, mastoiditis   Co morbidities that complicate the patient evaluation  See HPI   Additional history obtained:  Additional history obtained from EMR External records from outside source obtained and reviewed including hospital records   Lab Tests:  N/a   Imaging Studies ordered:  na   Cardiac Monitoring: / EKG:  The patient was maintained on a cardiac monitor.  I personally viewed and interpreted the cardiac monitored which showed an underlying rhythm of: Sinus rhythm   Consultations Obtained:  N/a   Problem List / ED Course / Critical interventions / Medication management  Left ear pain Reevaluation of the patient showed that the patient stayed the same I have reviewed the patients home medicines and have made adjustments as needed   Social Determinants of Health:  Denies  tobacco, illicit drug use   Test / Admission - Considered:  Left ear pain Vitals signs within normal range and stable throughout visit. 27 year old female presents emergency department complaints of left-sided ear pain.  Ear pain present for the past 3 to 4 days.  On exam, patient initially with cerumen impaction that was attempted removal on the emergency department a few different times with partial success.  Resulting still not observable TM with soft cerumen buildup obscuring view.  Patient with evidence of erythematous external auditory canal as well as some maceration and some drainage/discharge.  Concern for otitis externa.  Given nonexertional  nature of left-sided TM, will choose Ciprodex drops.  Discussion was had with patient regarding empiric treatment for otitis media given nonabsorbable nature of left-sided TM which she elected for.  Offered additional drainage via irrigation versus continued use of ear curette the patient declined in favor of continuing diluted peroxide washings at home.  Will recommend follow-up with primary care and provide ENT information in the outpatient setting for further evaluation/assessment.  Treatment plan discussed length with patient and s acknowledged understanding and was agreeable to set plan.  Patient well-appearing, afebrile no acute distress. Worrisome signs and symptoms were discussed with the patient, and the patient acknowledged understanding to return to the ED if noticed. Patient was stable upon discharge.          Final Clinical Impression(s) / ED Diagnoses Final diagnoses:  Left ear pain    Rx / DC Orders ED Discharge Orders          Ordered    cefdinir (OMNICEF) 300 MG capsule  2 times daily        06/26/23 1735    ciprofloxacin-dexamethasone (CIPRODEX) OTIC suspension  2 times daily        06/26/23 1735              Peter Garter, [place] 06/26/23 1746    Gwyneth Sprout, MD 06/28/23 1336

## 2023-06-26 NOTE — ED Notes (Signed)
 RN reviewed discharge instructions with pt. Pt verbalized understanding and had no further questions. VSS upon discharge.  

## 2023-06-26 NOTE — ED Triage Notes (Signed)
Patient here POV from Home.  Endorses Left Sided Otalgia for about 2 Days. No fevers. No Sore Throat. No Cough. No Congestion. Worsened with Rain.   NAD Noted during Triage. A&Ox4. GCS 15. Ambulatory.

## 2023-10-14 DIAGNOSIS — N390 Urinary tract infection, site not specified: Secondary | ICD-10-CM | POA: Diagnosis not present
